# Patient Record
Sex: Male | Born: 1980 | Race: White | Hispanic: No | Marital: Single | Smoking: Current every day smoker
Health system: Southern US, Community
[De-identification: ages and names within clinical notes are randomized; demographics above are authoritative.]

## PROBLEM LIST (undated history)

## (undated) DIAGNOSIS — F10239 Alcohol dependence with withdrawal, unspecified: Secondary | ICD-10-CM

## (undated) DIAGNOSIS — R569 Unspecified convulsions: Secondary | ICD-10-CM

## (undated) DIAGNOSIS — F10231 Alcohol dependence with withdrawal delirium: Secondary | ICD-10-CM

## (undated) DIAGNOSIS — K852 Alcohol induced acute pancreatitis without necrosis or infection: Secondary | ICD-10-CM

## (undated) DIAGNOSIS — F1020 Alcohol dependence, uncomplicated: Secondary | ICD-10-CM

## (undated) DIAGNOSIS — D649 Anemia, unspecified: Secondary | ICD-10-CM

---

## 2005-05-31 ENCOUNTER — Emergency Department: Payer: Self-pay | Admitting: Emergency Medicine

## 2005-12-24 ENCOUNTER — Emergency Department: Payer: Self-pay | Admitting: Internal Medicine

## 2006-07-07 HISTORY — PX: INCISION AND DRAINAGE ABSCESS: SHX5864

## 2008-05-26 ENCOUNTER — Emergency Department: Payer: Self-pay | Admitting: Emergency Medicine

## 2008-08-30 ENCOUNTER — Inpatient Hospital Stay: Payer: Self-pay | Admitting: Internal Medicine

## 2010-04-04 ENCOUNTER — Emergency Department: Payer: Self-pay | Admitting: Emergency Medicine

## 2011-07-08 DIAGNOSIS — F10239 Alcohol dependence with withdrawal, unspecified: Secondary | ICD-10-CM

## 2011-07-08 DIAGNOSIS — R569 Unspecified convulsions: Secondary | ICD-10-CM

## 2011-07-08 DIAGNOSIS — F10939 Alcohol use, unspecified with withdrawal, unspecified: Secondary | ICD-10-CM

## 2011-07-08 HISTORY — DX: Alcohol dependence with withdrawal, unspecified: F10.239

## 2011-07-08 HISTORY — DX: Alcohol use, unspecified with withdrawal, unspecified: F10.939

## 2011-07-08 HISTORY — DX: Unspecified convulsions: R56.9

## 2012-07-28 ENCOUNTER — Emergency Department: Payer: Self-pay | Admitting: Unknown Physician Specialty

## 2012-07-29 LAB — URINALYSIS, COMPLETE
Bilirubin,UR: NEGATIVE
Glucose,UR: 50 mg/dL (ref 0–75)
Leukocyte Esterase: NEGATIVE
Nitrite: NEGATIVE
Ph: 7 (ref 4.5–8.0)
RBC,UR: 1 /HPF (ref 0–5)
Specific Gravity: 1.003 (ref 1.003–1.030)
Squamous Epithelial: <1
WBC UR: 1 /HPF (ref 0–5)

## 2012-07-29 LAB — COMPREHENSIVE METABOLIC PANEL
Alkaline Phosphatase: 98 U/L (ref 50–136)
Bilirubin,Total: 0.8 mg/dL (ref 0.2–1.0)
Calcium, Total: 8.4 mg/dL — ABNORMAL LOW (ref 8.5–10.1)
Chloride: 96 mmol/L — ABNORMAL LOW (ref 98–107)
Co2: 19 mmol/L — ABNORMAL LOW (ref 21–32)
Creatinine: 1 mg/dL (ref 0.60–1.30)
EGFR (African American): >60
Potassium: 3.8 mmol/L (ref 3.5–5.1)
SGOT(AST): 72 U/L — ABNORMAL HIGH (ref 15–37)
SGPT (ALT): 88 U/L — ABNORMAL HIGH (ref 12–78)
Sodium: 131 mmol/L — ABNORMAL LOW (ref 136–145)
Total Protein: 7.6 g/dL (ref 6.4–8.2)

## 2012-07-29 LAB — CBC
HCT: 39.8 % — ABNORMAL LOW (ref 40.0–52.0)
HGB: 13.2 g/dL (ref 13.0–18.0)
Platelet: 148 10*3/uL — ABNORMAL LOW (ref 150–440)

## 2012-07-29 LAB — PROTIME-INR: INR: 1.1

## 2012-07-29 LAB — DRUG SCREEN, URINE
Barbiturates, Ur Screen: NEGATIVE (ref ?–200)
Cannabinoid 50 Ng, Ur ~~LOC~~: NEGATIVE (ref ?–50)
Cocaine Metabolite,Ur ~~LOC~~: NEGATIVE (ref ?–300)
MDMA (Ecstasy)Ur Screen: NEGATIVE (ref ?–500)
Methadone, Ur Screen: NEGATIVE (ref ?–300)
Opiate, Ur Screen: NEGATIVE (ref ?–300)
Tricyclic, Ur Screen: NEGATIVE (ref ?–1000)

## 2012-07-29 LAB — TROPONIN I: Troponin-I: <0.02 ng/mL

## 2012-07-29 LAB — ETHANOL
Ethanol %: <0.003 % (ref 0.000–0.080)
Ethanol: <3 mg/dL

## 2012-07-29 LAB — MAGNESIUM: Magnesium: 1.8 mg/dL

## 2012-07-29 LAB — LIPASE, BLOOD: Lipase: 310 U/L (ref 73–393)

## 2012-11-10 ENCOUNTER — Inpatient Hospital Stay: Payer: Self-pay | Admitting: Psychiatry

## 2012-11-10 DIAGNOSIS — I472 Ventricular tachycardia, unspecified: Secondary | ICD-10-CM

## 2012-11-10 LAB — URINALYSIS, COMPLETE
Bacteria: NONE SEEN
Blood: NEGATIVE
Glucose,UR: NEGATIVE mg/dL (ref 0–75)
Ketone: NEGATIVE
Leukocyte Esterase: NEGATIVE
Nitrite: NEGATIVE
RBC,UR: NONE SEEN /HPF (ref 0–5)
Squamous Epithelial: <1

## 2012-11-10 LAB — ETHANOL: Ethanol %: 0.169 % — ABNORMAL HIGH (ref 0.000–0.080)

## 2012-11-10 LAB — DRUG SCREEN, URINE
Amphetamines, Ur Screen: NEGATIVE (ref ?–1000)
Cocaine Metabolite,Ur ~~LOC~~: NEGATIVE (ref ?–300)
MDMA (Ecstasy)Ur Screen: NEGATIVE (ref ?–500)
Methadone, Ur Screen: NEGATIVE (ref ?–300)
Phencyclidine (PCP) Ur S: NEGATIVE (ref ?–25)

## 2012-11-10 LAB — COMPREHENSIVE METABOLIC PANEL
Albumin: 4.5 g/dL (ref 3.4–5.0)
Co2: 27 mmol/L (ref 21–32)
Creatinine: 0.75 mg/dL (ref 0.60–1.30)
EGFR (Non-African Amer.): >60
Glucose: 99 mg/dL (ref 65–99)
Osmolality: 273 (ref 275–301)
Potassium: 4.3 mmol/L (ref 3.5–5.1)
SGOT(AST): 297 U/L — ABNORMAL HIGH (ref 15–37)
Sodium: 138 mmol/L (ref 136–145)

## 2012-11-10 LAB — CBC
HGB: 14.7 g/dL (ref 13.0–18.0)
MCHC: 34.5 g/dL (ref 32.0–36.0)
MCV: 100 fL (ref 80–100)
RBC: 4.26 10*6/uL — ABNORMAL LOW (ref 4.40–5.90)
WBC: 3.8 10*3/uL (ref 3.8–10.6)

## 2012-11-10 LAB — LIPASE, BLOOD: Lipase: 599 U/L — ABNORMAL HIGH (ref 73–393)

## 2012-11-10 LAB — TSH: Thyroid Stimulating Horm: 1.73 u[IU]/mL

## 2012-11-11 LAB — LIPASE, BLOOD: Lipase: 469 U/L — ABNORMAL HIGH (ref 73–393)

## 2014-04-06 DIAGNOSIS — K852 Alcohol induced acute pancreatitis without necrosis or infection: Secondary | ICD-10-CM

## 2014-04-06 HISTORY — DX: Alcohol induced acute pancreatitis without necrosis or infection: K85.20

## 2014-07-07 DIAGNOSIS — F10231 Alcohol dependence with withdrawal delirium: Secondary | ICD-10-CM

## 2014-07-07 DIAGNOSIS — F10931 Alcohol use, unspecified with withdrawal delirium: Secondary | ICD-10-CM

## 2014-07-07 HISTORY — DX: Alcohol use, unspecified with withdrawal delirium: F10.931

## 2014-07-07 HISTORY — DX: Alcohol dependence with withdrawal delirium: F10.231

## 2014-07-30 ENCOUNTER — Inpatient Hospital Stay: Payer: Self-pay | Admitting: Internal Medicine

## 2014-07-30 LAB — COMPREHENSIVE METABOLIC PANEL
ALK PHOS: 115 U/L
Albumin: 3 g/dL — ABNORMAL LOW (ref 3.4–5.0)
Anion Gap: 10 (ref 7–16)
BILIRUBIN TOTAL: 0.8 mg/dL (ref 0.2–1.0)
BUN: 11 mg/dL (ref 7–18)
CO2: 23 mmol/L (ref 21–32)
CREATININE: 0.93 mg/dL (ref 0.60–1.30)
Calcium, Total: 8.2 mg/dL — ABNORMAL LOW (ref 8.5–10.1)
Chloride: 103 mmol/L (ref 98–107)
EGFR (African American): >60
Glucose: 137 mg/dL — ABNORMAL HIGH (ref 65–99)
OSMOLALITY: 273 (ref 275–301)
POTASSIUM: 4 mmol/L (ref 3.5–5.1)
SGOT(AST): 95 U/L — ABNORMAL HIGH (ref 15–37)
SGPT (ALT): 68 U/L — ABNORMAL HIGH
Sodium: 136 mmol/L (ref 136–145)
Total Protein: 7.5 g/dL (ref 6.4–8.2)

## 2014-07-30 LAB — CBC
HCT: 45.1 % (ref 40.0–52.0)
HGB: 15.2 g/dL (ref 13.0–18.0)
MCH: 34.3 pg — ABNORMAL HIGH (ref 26.0–34.0)
MCHC: 33.6 g/dL (ref 32.0–36.0)
MCV: 102 fL — ABNORMAL HIGH (ref 80–100)
Platelet: 189 10*3/uL (ref 150–440)
RBC: 4.43 10*6/uL (ref 4.40–5.90)
RDW: 12.7 % (ref 11.5–14.5)
WBC: 9.9 10*3/uL (ref 3.8–10.6)

## 2014-07-30 LAB — LIPASE, BLOOD: Lipase: 3550 U/L — ABNORMAL HIGH (ref 73–393)

## 2014-07-30 LAB — HEMOGLOBIN: HGB: 14.9 g/dL (ref 13.0–18.0)

## 2014-07-31 DIAGNOSIS — R000 Tachycardia, unspecified: Secondary | ICD-10-CM

## 2014-07-31 LAB — CBC WITH DIFFERENTIAL/PLATELET
BASOS ABS: 0 10*3/uL (ref 0.0–0.1)
BASOS PCT: 0.2 %
EOS ABS: 0 10*3/uL (ref 0.0–0.7)
EOS PCT: 0 %
HCT: 48.9 % (ref 40.0–52.0)
HGB: 16.3 g/dL (ref 13.0–18.0)
LYMPHS PCT: 3.6 %
Lymphocyte #: 0.9 10*3/uL — ABNORMAL LOW (ref 1.0–3.6)
MCH: 33.7 pg (ref 26.0–34.0)
MCHC: 33.3 g/dL (ref 32.0–36.0)
MCV: 101 fL — ABNORMAL HIGH (ref 80–100)
Monocyte #: 0.8 x10 3/mm (ref 0.2–1.0)
Monocyte %: 3.4 %
NEUTROS ABS: 22.3 10*3/uL — AB (ref 1.4–6.5)
Neutrophil %: 92.8 %
Platelet: 158 10*3/uL (ref 150–440)
RBC: 4.83 10*6/uL (ref 4.40–5.90)
RDW: 12.7 % (ref 11.5–14.5)
WBC: 24 10*3/uL — ABNORMAL HIGH (ref 3.8–10.6)

## 2014-07-31 LAB — COMPREHENSIVE METABOLIC PANEL
ALT: 44 U/L
Albumin: 2.4 g/dL — ABNORMAL LOW (ref 3.4–5.0)
Alkaline Phosphatase: 97 U/L
Anion Gap: 9 (ref 7–16)
BILIRUBIN TOTAL: 2 mg/dL — AB (ref 0.2–1.0)
BUN: 11 mg/dL (ref 7–18)
CO2: 22 mmol/L (ref 21–32)
CREATININE: 1.11 mg/dL (ref 0.60–1.30)
Calcium, Total: 6.6 mg/dL — CL (ref 8.5–10.1)
Chloride: 108 mmol/L — ABNORMAL HIGH (ref 98–107)
EGFR (African American): >60
GLUCOSE: 124 mg/dL — AB (ref 65–99)
Osmolality: 278 (ref 275–301)
Potassium: 3.9 mmol/L (ref 3.5–5.1)
SGOT(AST): 70 U/L — ABNORMAL HIGH (ref 15–37)
Sodium: 139 mmol/L (ref 136–145)
Total Protein: 6.1 g/dL — ABNORMAL LOW (ref 6.4–8.2)

## 2014-07-31 LAB — LIPID PANEL
Cholesterol: 102 mg/dL (ref 0–200)
HDL Cholesterol: 9 mg/dL — ABNORMAL LOW (ref 40–60)
LDL CHOLESTEROL, CALC: 36 mg/dL (ref 0–100)
Triglycerides: 283 mg/dL — ABNORMAL HIGH (ref 0–200)
VLDL CHOLESTEROL, CALC: 57 mg/dL — AB (ref 5–40)

## 2014-07-31 LAB — LIPASE, BLOOD: Lipase: 4368 U/L — ABNORMAL HIGH (ref 73–393)

## 2014-08-01 LAB — COMPREHENSIVE METABOLIC PANEL
ALBUMIN: 1.8 g/dL — AB (ref 3.4–5.0)
ALK PHOS: 72 U/L (ref 46–116)
Anion Gap: 9 (ref 7–16)
BILIRUBIN TOTAL: 1.4 mg/dL — AB (ref 0.2–1.0)
BUN: 9 mg/dL (ref 7–18)
CALCIUM: 5.9 mg/dL — AB (ref 8.5–10.1)
CREATININE: 0.96 mg/dL (ref 0.60–1.30)
Chloride: 110 mmol/L — ABNORMAL HIGH (ref 98–107)
Co2: 24 mmol/L (ref 21–32)
EGFR (African American): >60
Glucose: 107 mg/dL — ABNORMAL HIGH (ref 65–99)
Osmolality: 284 (ref 275–301)
POTASSIUM: 3.3 mmol/L — AB (ref 3.5–5.1)
SGOT(AST): 54 U/L — ABNORMAL HIGH (ref 15–37)
SGPT (ALT): 26 U/L (ref 14–63)
Sodium: 143 mmol/L (ref 136–145)
Total Protein: 5.1 g/dL — ABNORMAL LOW (ref 6.4–8.2)

## 2014-08-01 LAB — LIPASE, BLOOD: Lipase: 1910 U/L — ABNORMAL HIGH (ref 73–393)

## 2014-08-01 LAB — HEMOGLOBIN: HGB: 12.6 g/dL — ABNORMAL LOW

## 2014-08-02 LAB — COMPREHENSIVE METABOLIC PANEL
ALBUMIN: 1.8 g/dL — AB (ref 3.4–5.0)
Alkaline Phosphatase: 68 U/L (ref 46–116)
Anion Gap: 10 (ref 7–16)
BUN: 9 mg/dL (ref 7–18)
Bilirubin,Total: 1.4 mg/dL — ABNORMAL HIGH (ref 0.2–1.0)
CHLORIDE: 112 mmol/L — AB (ref 98–107)
Calcium, Total: 6.3 mg/dL — CL (ref 8.5–10.1)
Co2: 23 mmol/L (ref 21–32)
Creatinine: 0.8 mg/dL (ref 0.60–1.30)
EGFR (African American): >60
EGFR (Non-African Amer.): >60
Glucose: 63 mg/dL — ABNORMAL LOW (ref 65–99)
Osmolality: 285 (ref 275–301)
Potassium: 3.5 mmol/L (ref 3.5–5.1)
SGOT(AST): 50 U/L — ABNORMAL HIGH (ref 15–37)
SGPT (ALT): 25 U/L (ref 14–63)
SODIUM: 145 mmol/L (ref 136–145)
Total Protein: 5.3 g/dL — ABNORMAL LOW (ref 6.4–8.2)

## 2014-08-02 LAB — LIPASE, BLOOD: Lipase: 679 U/L — ABNORMAL HIGH (ref 73–393)

## 2014-08-02 LAB — PHOSPHORUS: PHOSPHORUS: 1.4 mg/dL — AB (ref 2.5–4.9)

## 2014-08-02 LAB — MAGNESIUM: MAGNESIUM: 2.2 mg/dL

## 2014-08-03 LAB — MAGNESIUM: MAGNESIUM: 2.1 mg/dL

## 2014-08-03 LAB — COMPREHENSIVE METABOLIC PANEL
ALK PHOS: 82 U/L (ref 46–116)
ANION GAP: 8 (ref 7–16)
Albumin: 1.9 g/dL — ABNORMAL LOW (ref 3.4–5.0)
BUN: 6 mg/dL — ABNORMAL LOW (ref 7–18)
Bilirubin,Total: 1.5 mg/dL — ABNORMAL HIGH (ref 0.2–1.0)
CALCIUM: 7.3 mg/dL — AB (ref 8.5–10.1)
CREATININE: 0.77 mg/dL (ref 0.60–1.30)
Chloride: 101 mmol/L (ref 98–107)
Co2: 28 mmol/L (ref 21–32)
Glucose: 82 mg/dL (ref 65–99)
OSMOLALITY: 271 (ref 275–301)
Potassium: 3.2 mmol/L — ABNORMAL LOW (ref 3.5–5.1)
SGOT(AST): 64 U/L — ABNORMAL HIGH (ref 15–37)
SGPT (ALT): 29 U/L (ref 14–63)
Sodium: 137 mmol/L (ref 136–145)
Total Protein: 5.8 g/dL — ABNORMAL LOW (ref 6.4–8.2)

## 2014-08-03 LAB — POTASSIUM: Potassium: 3.8 mmol/L (ref 3.5–5.1)

## 2014-08-03 LAB — LIPASE, BLOOD: Lipase: 376 U/L (ref 73–393)

## 2014-08-03 LAB — PHOSPHORUS: Phosphorus: 1.5 mg/dL — ABNORMAL LOW (ref 2.5–4.9)

## 2014-08-04 LAB — URINALYSIS, COMPLETE
BACTERIA: NONE SEEN
Bilirubin,UR: NEGATIVE
Blood: NEGATIVE
GLUCOSE, UR: NEGATIVE mg/dL (ref 0–75)
Ketone: NEGATIVE
Leukocyte Esterase: NEGATIVE
NITRITE: NEGATIVE
Ph: 8 (ref 4.5–8.0)
Protein: 30
RBC,UR: NONE SEEN /HPF (ref 0–5)
Specific Gravity: 1.01 (ref 1.003–1.030)
Squamous Epithelial: NONE SEEN

## 2014-08-04 LAB — BASIC METABOLIC PANEL
Anion Gap: 5 — ABNORMAL LOW (ref 7–16)
BUN: 6 mg/dL — ABNORMAL LOW (ref 7–18)
CALCIUM: 7.7 mg/dL — AB (ref 8.5–10.1)
CO2: 31 mmol/L (ref 21–32)
CREATININE: 0.74 mg/dL (ref 0.60–1.30)
Chloride: 101 mmol/L (ref 98–107)
EGFR (African American): >60
Glucose: 110 mg/dL — ABNORMAL HIGH (ref 65–99)
Osmolality: 272 (ref 275–301)
Potassium: 3.4 mmol/L — ABNORMAL LOW (ref 3.5–5.1)
SODIUM: 137 mmol/L (ref 136–145)

## 2014-08-04 LAB — CBC WITH DIFFERENTIAL/PLATELET
Basophil #: 0.1 10*3/uL (ref 0.0–0.1)
Basophil %: 0.3 %
EOS PCT: 1 %
Eosinophil #: 0.2 10*3/uL (ref 0.0–0.7)
HCT: 32.7 % — AB (ref 40.0–52.0)
HGB: 11 g/dL — AB (ref 13.0–18.0)
LYMPHS ABS: 1 10*3/uL (ref 1.0–3.6)
Lymphocyte %: 5.7 %
MCH: 33.8 pg (ref 26.0–34.0)
MCHC: 33.6 g/dL (ref 32.0–36.0)
MCV: 101 fL — ABNORMAL HIGH (ref 80–100)
Monocyte #: 2.3 x10 3/mm — ABNORMAL HIGH (ref 0.2–1.0)
Monocyte %: 12.6 %
Neutrophil #: 14.8 10*3/uL — ABNORMAL HIGH (ref 1.4–6.5)
Neutrophil %: 80.4 %
Platelet: 202 10*3/uL (ref 150–440)
RBC: 3.25 10*6/uL — ABNORMAL LOW (ref 4.40–5.90)
RDW: 12.1 % (ref 11.5–14.5)
WBC: 18.4 10*3/uL — AB (ref 3.8–10.6)

## 2014-08-04 LAB — POTASSIUM: POTASSIUM: 4.1 mmol/L (ref 3.5–5.1)

## 2014-08-05 LAB — MAGNESIUM: MAGNESIUM: 2.3 mg/dL

## 2014-08-06 LAB — BASIC METABOLIC PANEL
Anion Gap: 5 — ABNORMAL LOW (ref 7–16)
BUN: 7 mg/dL (ref 7–18)
CHLORIDE: 100 mmol/L (ref 98–107)
Calcium, Total: 8.2 mg/dL — ABNORMAL LOW (ref 8.5–10.1)
Co2: 32 mmol/L (ref 21–32)
Creatinine: 0.89 mg/dL (ref 0.60–1.30)
EGFR (Non-African Amer.): >60
Glucose: 138 mg/dL — ABNORMAL HIGH (ref 65–99)
Osmolality: 274 (ref 275–301)
Potassium: 3.7 mmol/L (ref 3.5–5.1)
SODIUM: 137 mmol/L (ref 136–145)

## 2014-08-06 LAB — OCCULT BLOOD X 1 CARD TO LAB, STOOL: OCCULT BLOOD, FECES: NEGATIVE

## 2014-08-06 LAB — LIPASE, BLOOD: LIPASE: 662 U/L — AB (ref 73–393)

## 2014-08-07 DIAGNOSIS — F1020 Alcohol dependence, uncomplicated: Secondary | ICD-10-CM

## 2014-08-07 DIAGNOSIS — D649 Anemia, unspecified: Secondary | ICD-10-CM

## 2014-08-07 HISTORY — DX: Alcohol dependence, uncomplicated: F10.20

## 2014-08-07 HISTORY — DX: Anemia, unspecified: D64.9

## 2014-08-08 LAB — TPN PANEL
ACTIVATED PTT: 32.3 s (ref 23.6–35.9)
ALK PHOS: 99 U/L (ref 46–116)
ANION GAP: 8 (ref 7–16)
AST: 48 U/L — AB (ref 15–37)
Albumin: 2.1 g/dL — ABNORMAL LOW (ref 3.4–5.0)
BUN: 7 mg/dL (ref 7–18)
CALCIUM: 8.6 mg/dL (ref 8.5–10.1)
CO2: 27 mmol/L (ref 21–32)
CREATININE: 0.78 mg/dL (ref 0.60–1.30)
Chloride: 103 mmol/L (ref 98–107)
Cholesterol: 92 mg/dL (ref 0–200)
EGFR (African American): >60
Glucose: 146 mg/dL — ABNORMAL HIGH (ref 65–99)
HGB: 10.3 g/dL — ABNORMAL LOW (ref 13.0–18.0)
INR: 1.3
Magnesium: 2.1 mg/dL
Osmolality: 276 (ref 275–301)
Phosphorus: 3.1 mg/dL (ref 2.5–4.9)
Platelet: 484 10*3/uL — ABNORMAL HIGH (ref 150–440)
Potassium: 3.6 mmol/L (ref 3.5–5.1)
Prothrombin Time: 16.1 secs — ABNORMAL HIGH
SODIUM: 138 mmol/L (ref 136–145)
TOTAL PROTEIN: 6.9 g/dL (ref 6.4–8.2)
TRIGLYCERIDES: 121 mg/dL (ref 0–200)
WBC: 26.4 10*3/uL — AB (ref 3.8–10.6)

## 2014-08-08 LAB — AMMONIA: Ammonia, Plasma: 30 mcmol/L (ref 11–32)

## 2014-08-09 LAB — LIPASE, BLOOD: LIPASE: 1081 U/L — AB (ref 73–393)

## 2014-08-09 LAB — CBC WITH DIFFERENTIAL/PLATELET
Bands: 4 %
EOS PCT: 1 %
HCT: 30.3 % — AB (ref 40.0–52.0)
HGB: 10.2 g/dL — AB (ref 13.0–18.0)
Lymphocytes: 5 %
MCH: 33.5 pg (ref 26.0–34.0)
MCHC: 33.8 g/dL (ref 32.0–36.0)
MCV: 99 fL (ref 80–100)
METAMYELOCYTE: 4 %
MYELOCYTE: 3 %
Monocytes: 9 %
PLATELETS: 591 10*3/uL — AB (ref 150–440)
Promyelocyte: 1 %
RBC: 3.06 10*6/uL — AB (ref 4.40–5.90)
RDW: 12.5 % (ref 11.5–14.5)
Segmented Neutrophils: 73 %
WBC: 29.8 10*3/uL — AB (ref 3.8–10.6)

## 2014-08-09 LAB — HEPATIC FUNCTION PANEL A (ARMC)
ALK PHOS: 86 U/L (ref 46–116)
AST: 47 U/L — AB (ref 15–37)
Albumin: 2.1 g/dL — ABNORMAL LOW (ref 3.4–5.0)
Bilirubin, Direct: 0.2 mg/dL (ref 0.0–0.2)
Bilirubin,Total: 0.5 mg/dL (ref 0.2–1.0)
SGPT (ALT): 64 U/L — ABNORMAL HIGH (ref 14–63)
Total Protein: 7.2 g/dL (ref 6.4–8.2)

## 2014-08-09 LAB — CULTURE, BLOOD (SINGLE)

## 2014-08-09 LAB — BASIC METABOLIC PANEL
ANION GAP: 8 (ref 7–16)
BUN: 9 mg/dL (ref 7–18)
CALCIUM: 8.6 mg/dL (ref 8.5–10.1)
Chloride: 103 mmol/L (ref 98–107)
Co2: 26 mmol/L (ref 21–32)
Creatinine: 0.91 mg/dL (ref 0.60–1.30)
EGFR (African American): >60
GLUCOSE: 141 mg/dL — AB (ref 65–99)
OSMOLALITY: 275 (ref 275–301)
POTASSIUM: 3.7 mmol/L (ref 3.5–5.1)
Sodium: 137 mmol/L (ref 136–145)

## 2014-08-09 LAB — MAGNESIUM: Magnesium: 2.1 mg/dL

## 2014-08-09 LAB — PHOSPHORUS: PHOSPHORUS: 4.6 mg/dL (ref 2.5–4.9)

## 2014-08-10 LAB — BASIC METABOLIC PANEL
Anion Gap: 5 — ABNORMAL LOW (ref 7–16)
BUN: 11 mg/dL (ref 7–18)
CALCIUM: 8.4 mg/dL — AB (ref 8.5–10.1)
CO2: 29 mmol/L (ref 21–32)
Chloride: 101 mmol/L (ref 98–107)
Creatinine: 0.87 mg/dL (ref 0.60–1.30)
EGFR (Non-African Amer.): >60
Glucose: 122 mg/dL — ABNORMAL HIGH (ref 65–99)
OSMOLALITY: 271 (ref 275–301)
Potassium: 4 mmol/L (ref 3.5–5.1)
Sodium: 135 mmol/L — ABNORMAL LOW (ref 136–145)

## 2014-08-10 LAB — MAGNESIUM: MAGNESIUM: 2.3 mg/dL

## 2014-08-10 LAB — PHOSPHORUS: Phosphorus: 3.7 mg/dL (ref 2.5–4.9)

## 2014-08-11 LAB — PHOSPHORUS: Phosphorus: 3.4 mg/dL (ref 2.5–4.9)

## 2014-08-11 LAB — BASIC METABOLIC PANEL
Anion Gap: 7 (ref 7–16)
BUN: 11 mg/dL (ref 7–18)
CO2: 27 mmol/L (ref 21–32)
CREATININE: 0.8 mg/dL (ref 0.60–1.30)
Calcium, Total: 8.3 mg/dL — ABNORMAL LOW (ref 8.5–10.1)
Chloride: 102 mmol/L (ref 98–107)
EGFR (Non-African Amer.): >60
Glucose: 119 mg/dL — ABNORMAL HIGH (ref 65–99)
Osmolality: 272 (ref 275–301)
Potassium: 4 mmol/L (ref 3.5–5.1)
SODIUM: 136 mmol/L (ref 136–145)

## 2014-08-11 LAB — MAGNESIUM: MAGNESIUM: 2.3 mg/dL

## 2014-08-12 LAB — HEPATIC FUNCTION PANEL A (ARMC)
Albumin: 2.1 g/dL — ABNORMAL LOW (ref 3.4–5.0)
Alkaline Phosphatase: 73 U/L (ref 46–116)
Bilirubin, Direct: 0.2 mg/dL (ref 0.0–0.2)
Bilirubin,Total: 0.4 mg/dL (ref 0.2–1.0)
SGOT(AST): 38 U/L — ABNORMAL HIGH (ref 15–37)
SGPT (ALT): 47 U/L (ref 14–63)
TOTAL PROTEIN: 7.5 g/dL (ref 6.4–8.2)

## 2014-08-12 LAB — BASIC METABOLIC PANEL
Anion Gap: 7 (ref 7–16)
BUN: 10 mg/dL (ref 7–18)
CREATININE: 0.8 mg/dL (ref 0.60–1.30)
Calcium, Total: 8.5 mg/dL (ref 8.5–10.1)
Chloride: 100 mmol/L (ref 98–107)
Co2: 28 mmol/L (ref 21–32)
EGFR (African American): >60
EGFR (Non-African Amer.): >60
Glucose: 125 mg/dL — ABNORMAL HIGH (ref 65–99)
Osmolality: 271 (ref 275–301)
POTASSIUM: 4.2 mmol/L (ref 3.5–5.1)
Sodium: 135 mmol/L — ABNORMAL LOW (ref 136–145)

## 2014-08-12 LAB — CBC WITH DIFFERENTIAL/PLATELET
BANDS NEUTROPHIL: 4 %
Comment - H1-Com2: NORMAL
Eosinophil: 2 %
HCT: 28.7 % — ABNORMAL LOW (ref 40.0–52.0)
HGB: 9.5 g/dL — ABNORMAL LOW (ref 13.0–18.0)
LYMPHS PCT: 7 %
MCH: 32.6 pg (ref 26.0–34.0)
MCHC: 33 g/dL (ref 32.0–36.0)
MCV: 99 fL (ref 80–100)
Metamyelocyte: 2 %
Monocytes: 7 %
Myelocyte: 2 %
PLATELETS: 863 10*3/uL — AB (ref 150–440)
RBC: 2.91 10*6/uL — ABNORMAL LOW (ref 4.40–5.90)
RDW: 12.1 % (ref 11.5–14.5)
Segmented Neutrophils: 76 %
WBC: 26 10*3/uL — ABNORMAL HIGH (ref 3.8–10.6)

## 2014-08-12 LAB — LIPASE, BLOOD: Lipase: 850 U/L — ABNORMAL HIGH (ref 73–393)

## 2014-08-12 LAB — MAGNESIUM: Magnesium: 2.2 mg/dL

## 2014-08-12 LAB — PHOSPHORUS: PHOSPHORUS: 4.4 mg/dL (ref 2.5–4.9)

## 2014-08-13 LAB — BASIC METABOLIC PANEL
ANION GAP: 9 (ref 7–16)
BUN: 9 mg/dL (ref 7–18)
CALCIUM: 8.7 mg/dL (ref 8.5–10.1)
Chloride: 98 mmol/L (ref 98–107)
Co2: 27 mmol/L (ref 21–32)
Creatinine: 0.87 mg/dL (ref 0.60–1.30)
EGFR (African American): >60
GLUCOSE: 153 mg/dL — AB (ref 65–99)
OSMOLALITY: 270 (ref 275–301)
POTASSIUM: 4.4 mmol/L (ref 3.5–5.1)
Sodium: 134 mmol/L — ABNORMAL LOW (ref 136–145)

## 2014-08-13 LAB — MAGNESIUM: Magnesium: 2.2 mg/dL

## 2014-08-13 LAB — PHOSPHORUS: PHOSPHORUS: 4.4 mg/dL (ref 2.5–4.9)

## 2014-08-14 LAB — CULTURE, BLOOD (SINGLE)

## 2014-08-14 LAB — BASIC METABOLIC PANEL
Anion Gap: 5 — ABNORMAL LOW (ref 7–16)
BUN: 10 mg/dL (ref 7–18)
CALCIUM: 8.8 mg/dL (ref 8.5–10.1)
CREATININE: 0.84 mg/dL (ref 0.60–1.30)
Chloride: 100 mmol/L (ref 98–107)
Co2: 30 mmol/L (ref 21–32)
EGFR (African American): >60
EGFR (Non-African Amer.): >60
GLUCOSE: 121 mg/dL — AB (ref 65–99)
Osmolality: 270 (ref 275–301)
POTASSIUM: 4.5 mmol/L (ref 3.5–5.1)
SODIUM: 135 mmol/L — AB (ref 136–145)

## 2014-08-14 LAB — PHOSPHORUS: Phosphorus: 4.9 mg/dL (ref 2.5–4.9)

## 2014-08-14 LAB — LIPASE, BLOOD: Lipase: 717 U/L — ABNORMAL HIGH (ref 73–393)

## 2014-08-14 LAB — MAGNESIUM: Magnesium: 2.2 mg/dL

## 2014-08-15 ENCOUNTER — Observation Stay (HOSPITAL_COMMUNITY): Payer: Self-pay

## 2014-08-15 ENCOUNTER — Encounter (HOSPITAL_COMMUNITY): Payer: Self-pay | Admitting: General Surgery

## 2014-08-15 ENCOUNTER — Inpatient Hospital Stay (HOSPITAL_COMMUNITY)
Admission: AD | Admit: 2014-08-15 | Discharge: 2014-08-20 | DRG: 439 | Disposition: A | Discharge status: Home / Self Care | Payer: Self-pay | Source: Other Acute Inpatient Hospital | Attending: Internal Medicine | Admitting: Internal Medicine

## 2014-08-15 DIAGNOSIS — F1020 Alcohol dependence, uncomplicated: Secondary | ICD-10-CM | POA: Diagnosis present

## 2014-08-15 DIAGNOSIS — R4182 Altered mental status, unspecified: Secondary | ICD-10-CM

## 2014-08-15 DIAGNOSIS — K863 Pseudocyst of pancreas: Secondary | ICD-10-CM | POA: Diagnosis present

## 2014-08-15 DIAGNOSIS — R4781 Slurred speech: Secondary | ICD-10-CM | POA: Diagnosis present

## 2014-08-15 DIAGNOSIS — F1010 Alcohol abuse, uncomplicated: Secondary | ICD-10-CM | POA: Diagnosis present

## 2014-08-15 DIAGNOSIS — K852 Alcohol induced acute pancreatitis without necrosis or infection: Secondary | ICD-10-CM

## 2014-08-15 DIAGNOSIS — D473 Essential (hemorrhagic) thrombocythemia: Secondary | ICD-10-CM | POA: Diagnosis present

## 2014-08-15 DIAGNOSIS — G253 Myoclonus: Secondary | ICD-10-CM | POA: Diagnosis present

## 2014-08-15 DIAGNOSIS — Z811 Family history of alcohol abuse and dependence: Secondary | ICD-10-CM

## 2014-08-15 DIAGNOSIS — K859 Acute pancreatitis without necrosis or infection, unspecified: Secondary | ICD-10-CM | POA: Diagnosis present

## 2014-08-15 DIAGNOSIS — J9811 Atelectasis: Secondary | ICD-10-CM | POA: Diagnosis present

## 2014-08-15 DIAGNOSIS — G9340 Encephalopathy, unspecified: Secondary | ICD-10-CM | POA: Diagnosis present

## 2014-08-15 DIAGNOSIS — Z599 Problem related to housing and economic circumstances, unspecified: Secondary | ICD-10-CM

## 2014-08-15 DIAGNOSIS — Z452 Encounter for adjustment and management of vascular access device: Secondary | ICD-10-CM

## 2014-08-15 DIAGNOSIS — E512 Wernicke's encephalopathy: Secondary | ICD-10-CM | POA: Diagnosis present

## 2014-08-15 DIAGNOSIS — F17210 Nicotine dependence, cigarettes, uncomplicated: Secondary | ICD-10-CM | POA: Diagnosis present

## 2014-08-15 DIAGNOSIS — D649 Anemia, unspecified: Secondary | ICD-10-CM | POA: Diagnosis present

## 2014-08-15 HISTORY — DX: Alcohol dependence, uncomplicated: F10.20

## 2014-08-15 HISTORY — DX: Unspecified convulsions: R56.9

## 2014-08-15 HISTORY — DX: Alcohol dependence with withdrawal, unspecified: F10.239

## 2014-08-15 HISTORY — DX: Alcohol dependence with withdrawal delirium: F10.231

## 2014-08-15 HISTORY — DX: Anemia, unspecified: D64.9

## 2014-08-15 HISTORY — DX: Alcohol induced acute pancreatitis without necrosis or infection: K85.20

## 2014-08-15 LAB — COMPREHENSIVE METABOLIC PANEL
ALK PHOS: 72 U/L (ref 39–117)
ALT: 40 U/L (ref 0–53)
AST: 40 U/L — ABNORMAL HIGH (ref 0–37)
Albumin: 2.8 g/dL — ABNORMAL LOW (ref 3.5–5.2)
Anion gap: 12 (ref 5–15)
BUN: 12 mg/dL (ref 6–23)
CO2: 23 mmol/L (ref 19–32)
Calcium: 8.9 mg/dL (ref 8.4–10.5)
Chloride: 101 mmol/L (ref 96–112)
Creatinine, Ser: 0.69 mg/dL (ref 0.50–1.35)
GFR calc non Af Amer: >90 mL/min (ref >90)
Glucose, Bld: 90 mg/dL (ref 70–99)
POTASSIUM: 4.8 mmol/L (ref 3.5–5.1)
SODIUM: 136 mmol/L (ref 135–145)
TOTAL PROTEIN: 8.1 g/dL (ref 6.0–8.3)
Total Bilirubin: 0.7 mg/dL (ref 0.3–1.2)

## 2014-08-15 LAB — CBC
HEMATOCRIT: 29.8 % — AB (ref 39.0–52.0)
HEMOGLOBIN: 9.7 g/dL — AB (ref 13.0–17.0)
MCH: 32.7 pg (ref 26.0–34.0)
MCHC: 32.6 g/dL (ref 30.0–36.0)
MCV: 100.3 fL — ABNORMAL HIGH (ref 78.0–100.0)
PLATELETS: 628 10*3/uL — AB (ref 150–400)
RBC: 2.97 MIL/uL — AB (ref 4.22–5.81)
RDW: 12.3 % (ref 11.5–15.5)
WBC: 21 10*3/uL — AB (ref 4.0–10.5)

## 2014-08-15 LAB — PHOSPHORUS: PHOSPHORUS: 5.1 mg/dL — AB (ref 2.3–4.6)

## 2014-08-15 LAB — MAGNESIUM: Magnesium: 1.9 mg/dL (ref 1.5–2.5)

## 2014-08-15 LAB — LIPASE, BLOOD: LIPASE: 89 U/L — AB (ref 11–59)

## 2014-08-15 MED ORDER — ONDANSETRON HCL 4 MG PO TABS: 4.0000 mg | ORAL_TABLET | Freq: Four times a day (QID) | ORAL | Status: DC | PRN

## 2014-08-15 MED ORDER — IOHEXOL 300 MG/ML  SOLN
100.0000 mL | Freq: Once | INTRAMUSCULAR | Status: AC | PRN
  Administered 2014-08-15: 100 mL via INTRAVENOUS

## 2014-08-15 MED ORDER — IOHEXOL 300 MG/ML  SOLN
50.0000 mL | Freq: Once | INTRAMUSCULAR | Status: AC | PRN
  Administered 2014-08-15: 50 mL via ORAL

## 2014-08-15 MED ORDER — ONDANSETRON HCL 4 MG/2ML IJ SOLN: 4.0000 mg | Freq: Four times a day (QID) | INTRAMUSCULAR | Status: DC | PRN

## 2014-08-15 MED ORDER — OXYCODONE HCL 5 MG PO TABS
5.0000 mg | ORAL_TABLET | ORAL | Status: DC | PRN
  Administered 2014-08-16 – 2014-08-20 (×8): 10 mg via ORAL
  Filled 2014-08-15 (×8): qty 2

## 2014-08-15 MED ORDER — SODIUM CHLORIDE 0.9 % IV SOLN
INTRAVENOUS | Status: DC
  Administered 2014-08-15 – 2014-08-19 (×7): via INTRAVENOUS

## 2014-08-15 MED ORDER — MORPHINE SULFATE 2 MG/ML IJ SOLN
2.0000 mg | INTRAMUSCULAR | Status: DC | PRN
  Administered 2014-08-15 – 2014-08-16 (×3): 2 mg via INTRAVENOUS
  Filled 2014-08-15 (×3): qty 1

## 2014-08-15 MED ORDER — HEPARIN SODIUM (PORCINE) 5000 UNIT/ML IJ SOLN
5000.0000 [IU] | Freq: Three times a day (TID) | INTRAMUSCULAR | Status: DC
  Administered 2014-08-15 – 2014-08-20 (×12): 5000 [IU] via SUBCUTANEOUS
  Filled 2014-08-15 (×18): qty 1

## 2014-08-15 NOTE — Consult Note (Signed)
Reason for Consult:pancreatitis Referring Physician: Dr. Audelia Acton Brandon Oneill is an 34 y.o. male.  HPI: The pt is a 34 yo wm who presented to [facility] about 3 weeks ago with abdominal pain. He has been severely abusing alcohol for the last 10 years. He was found to have pancreatitis. CT done 1/25 shows severe pancreatitis with some free inflammatory fluid in the abdomen but no evidence of pancreatic necrosis or pseudocyst. He was scheduled to be transferred to [facility] but ended up getting tranferred to [facility]. He complains of abdominal pain. He has not had any nausea or vomiting in a couple weeks.  History reviewed. No pertinent past medical history.  History reviewed. No pertinent past surgical history.  History reviewed. No pertinent family history.  Social History:  has no tobacco, alcohol, and drug history on file.  Allergies: Not on File  Medications: I have reviewed the patient's current medications.  No results found for this or any previous visit (from the past 48 hour(s)).  No results found.  Review of Systems  Constitutional: Negative.   HENT: Negative.   Eyes: Negative.   Respiratory: Negative.   Cardiovascular: Negative.   Gastrointestinal: Positive for abdominal pain. Negative for nausea and vomiting.  Genitourinary: Negative.   Musculoskeletal: Negative.   Skin: Negative.   Neurological: Negative.   Endo/Heme/Allergies: Negative.   Psychiatric/Behavioral: The patient is nervous/anxious.    Blood pressure 113/61, pulse 100, temperature 98.6 F (37 C), temperature source Oral, resp. rate 18, height 6' (1.829 m), weight 200 lb (90.719 kg), SpO2 100 %. Physical Exam  Constitutional: He is oriented to person, place, and time. He appears well-developed and well-nourished.  HENT:  Head: Normocephalic and atraumatic.  Eyes: Conjunctivae and EOM are normal. Pupils are equal, round, and reactive to light.  Neck: Normal range of  motion. Neck supple.  Cardiovascular: Normal rate, regular rhythm and normal heart sounds.   Respiratory: Effort normal and breath sounds normal.  GI: Soft. Bowel sounds are normal.  There is moderate central tenderness  Musculoskeletal: Normal range of motion.  Neurological: He is alert and oriented to person, place, and time.  Skin: Skin is warm and dry.  Psychiatric: He has a normal mood and affect. His behavior is normal.    Assessment/Plan: The pt has severe pancreatitis from alcohol abuse. There was no evidence of pseudocyst or necrosis from the scan 2 weeks ago and I do not see any other scans on the computer since then. At this point I would consider rescanning him to see what he has now with respect to the pancreas. He may need tpn for nutrition support and bowel rest to let the pancreas continue to cool down. Will follow  TOTH III,Cele Mote S 08/15/2014, 2:51 PM

## 2014-08-15 NOTE — H&P (Signed)
[facility] History and Physical  Philo Kurtz IFO:[id number] DOB: [birth year] DOA: 08/15/2014  Referring physician: Dr. Aileen Fass PCP: No primary care provider on file.   Chief Complaint: abdominal discomfort  HPI: Brandon Oneill is a 34 y.o. male  Pt with recent admission at [facility] to acute pancreatitis. Patient was transferred here to [facility] for further evaluation and recommendations for suspected pseudocyst. The patient reports that he has not drank alcohol for more than 1 week. Still having abdominal discomfort. Denies any fevers or chills. No nausea reported.  Denies any emesis.   Review of Systems:  Constitutional:  No weight loss, night sweats, Fevers, chills, fatigue.  HEENT:  No headaches, Difficulty swallowing,Tooth/dental problems,Sore throat,  No sneezing, itching, ear ache, nasal congestion, post nasal drip,  Cardio-vascular:  No chest pain, Orthopnea, PND, swelling in lower extremities, anasarca, dizziness, palpitations  GI:  No heartburn, indigestion,+ abdominal pain, +nausea, vomiting, diarrhea, change in bowel habits,+ loss of appetite  Resp:  No shortness of breath with exertion or at rest. No excess mucus, no productive cough, No non-productive cough, No coughing up of blood.No change in color of mucus.No wheezing.No chest wall deformity  Skin:  no rash or lesions.  GU:  no dysuria, change in color of urine, no urgency or frequency. No flank pain.  Musculoskeletal:  No joint pain or swelling. No decreased range of motion. No back pain.  Psych:  No change in mood or affect. No depression or anxiety. No memory loss.   History reviewed. Medical history. History of alcohol use History reviewed. Surgical history: none reported. Social History: consumes alcohol daily prior to admission  Not on File  Family history - none reported when asked directly  Prior to Admission medications   Not on File   Physical  Exam: Filed Vitals:   08/15/14 1255  BP: 113/61  Pulse: 100  Temp: 98.6 F (37 C)  TempSrc: Oral  Resp: 18  Height: 6' (1.829 m)  Weight: 90.719 kg (200 lb)  SpO2: 100%    Wt Readings from Last 3 Encounters:  08/15/14 90.719 kg (200 lb)    General:  Appears calm and comfortable Eyes: PERRL, normal lids, irises & conjunctiva ENT: grossly normal hearing, lips & tongue Neck: no LAD, masses or thyromegaly Cardiovascular: RRR, no m/r/g. No LE edema. Telemetry: SR, no arrhythmias  Respiratory: CTA bilaterally, no w/r/r. Normal respiratory effort. Abdomen: soft, generalized discomfort with deep palpation, no rebound tenderness, + bowel sounds Skin: no rash or induration seen on limited exam Musculoskeletal: grossly normal tone BUE/BLE Psychiatric: grossly normal mood and affect, speech fluent and appropriate Neurologic: grossly non-focal.          Labs on Admission:  Basic Metabolic Panel: No results for input(s): NA, K, CL, CO2, GLUCOSE, BUN, CREATININE, CALCIUM, MG, PHOS in the last 168 hours. Liver Function Tests: No results for input(s): AST, ALT, ALKPHOS, BILITOT, PROT, ALBUMIN in the last 168 hours. No results for input(s): LIPASE, AMYLASE in the last 168 hours. No results for input(s): AMMONIA in the last 168 hours. CBC: No results for input(s): WBC, NEUTROABS, HGB, HCT, MCV, PLT in the last 168 hours. Cardiac Enzymes: No results for input(s): CKTOTAL, CKMB, CKMBINDEX, TROPONINI in the last 168 hours.  BNP (last 3 results) No results for input(s): BNP in the last 8760 hours.  ProBNP (last 3 results) No results for input(s): PROBNP in the last 8760 hours.  CBG: No results for input(s): GLUCAP in the last 168  hours.  Radiological Exams on Admission: No results found.   Assessment/Plan Active Problems:   Pancreatitis - Patient transferred from my understanding because of pancreatic pseudocyts but our surgeon reports that imaging studies do no support this.  Will repeat abdominal ct scan - Secondary to alcohol use - clear liquid diet - advance diet as tolerated to low fat. Will consider adding creon once patient tolerating solid food.  H/o alcohol abuse - His last alcoholic drink was more than 1 month ago - as such will not place on CIWA protocol   Code Status: full  DVT Prophylaxis: heparin Family Communication: none at bedside Disposition Plan: Pending improvement in condition.   Time spent: > 35 minutes  Velvet Bathe [facility] Pager [phone]

## 2014-08-16 ENCOUNTER — Observation Stay (HOSPITAL_COMMUNITY): Payer: Self-pay

## 2014-08-16 ENCOUNTER — Observation Stay (HOSPITAL_COMMUNITY)
Admission: AD | Admit: 2014-08-16 | Discharge: 2014-08-16 | Disposition: A | Discharge status: Home / Self Care | Payer: MEDICAID | Source: Other Acute Inpatient Hospital | Attending: Internal Medicine | Admitting: Internal Medicine

## 2014-08-16 DIAGNOSIS — K863 Pseudocyst of pancreas: Secondary | ICD-10-CM | POA: Diagnosis present

## 2014-08-16 DIAGNOSIS — G9340 Encephalopathy, unspecified: Secondary | ICD-10-CM | POA: Diagnosis present

## 2014-08-16 DIAGNOSIS — D649 Anemia, unspecified: Secondary | ICD-10-CM | POA: Diagnosis present

## 2014-08-16 DIAGNOSIS — K859 Acute pancreatitis without necrosis or infection, unspecified: Secondary | ICD-10-CM | POA: Diagnosis present

## 2014-08-16 DIAGNOSIS — F1010 Alcohol abuse, uncomplicated: Secondary | ICD-10-CM | POA: Diagnosis present

## 2014-08-16 LAB — BASIC METABOLIC PANEL
ANION GAP: 10 (ref 5–15)
BUN: 11 mg/dL (ref 6–23)
CALCIUM: 8.4 mg/dL (ref 8.4–10.5)
CO2: 25 mmol/L (ref 19–32)
Chloride: 99 mmol/L (ref 96–112)
Creatinine, Ser: 0.72 mg/dL (ref 0.50–1.35)
GFR calc non Af Amer: >90 mL/min (ref >90)
Glucose, Bld: 94 mg/dL (ref 70–99)
Potassium: 4.3 mmol/L (ref 3.5–5.1)
Sodium: 134 mmol/L — ABNORMAL LOW (ref 135–145)

## 2014-08-16 LAB — RAPID URINE DRUG SCREEN, HOSP PERFORMED
Amphetamines: NOT DETECTED
BENZODIAZEPINES: NOT DETECTED
Barbiturates: NOT DETECTED
Cocaine: NOT DETECTED
Opiates: POSITIVE — AB
Tetrahydrocannabinol: NOT DETECTED

## 2014-08-16 LAB — CBC
HEMATOCRIT: 27 % — AB (ref 39.0–52.0)
Hemoglobin: 9 g/dL — ABNORMAL LOW (ref 13.0–17.0)
MCH: 32.8 pg (ref 26.0–34.0)
MCHC: 33.3 g/dL (ref 30.0–36.0)
MCV: 98.5 fL (ref 78.0–100.0)
Platelets: 844 10*3/uL — ABNORMAL HIGH (ref 150–400)
RBC: 2.74 MIL/uL — ABNORMAL LOW (ref 4.22–5.81)
RDW: 12.2 % (ref 11.5–15.5)
WBC: 16.8 10*3/uL — ABNORMAL HIGH (ref 4.0–10.5)

## 2014-08-16 LAB — AMMONIA: Ammonia: 19 umol/L (ref 11–32)

## 2014-08-16 LAB — LIPASE, BLOOD: LIPASE: 99 U/L — AB (ref 11–59)

## 2014-08-16 MED ORDER — LORAZEPAM 1 MG PO TABS
1.0000 mg | ORAL_TABLET | Freq: Four times a day (QID) | ORAL | Status: AC | PRN
  Administered 2014-08-16 – 2014-08-18 (×3): 1 mg via ORAL
  Filled 2014-08-16 (×3): qty 1

## 2014-08-16 MED ORDER — LORAZEPAM 2 MG/ML IJ SOLN
1.0000 mg | Freq: Four times a day (QID) | INTRAMUSCULAR | Status: DC | PRN
  Administered 2014-08-16 – 2014-08-17 (×2): 1 mg via INTRAVENOUS
  Filled 2014-08-16 (×2): qty 1

## 2014-08-16 MED ORDER — FOLIC ACID 1 MG PO TABS
1.0000 mg | ORAL_TABLET | Freq: Every day | ORAL | Status: DC
  Administered 2014-08-16 – 2014-08-20 (×5): 1 mg via ORAL
  Filled 2014-08-16 (×5): qty 1

## 2014-08-16 MED ORDER — THIAMINE HCL 100 MG/ML IJ SOLN
100.0000 mg | Freq: Every day | INTRAMUSCULAR | Status: DC
  Filled 2014-08-16: qty 1

## 2014-08-16 MED ORDER — ADULT MULTIVITAMIN W/MINERALS CH
1.0000 | ORAL_TABLET | Freq: Every day | ORAL | Status: DC
  Administered 2014-08-16 – 2014-08-20 (×5): 1 via ORAL
  Filled 2014-08-16 (×5): qty 1

## 2014-08-16 MED ORDER — ALTEPLASE 2 MG IJ SOLR
2.0000 mg | Freq: Once | INTRAMUSCULAR | Status: AC
  Administered 2014-08-16: 2 mg
  Filled 2014-08-16: qty 2

## 2014-08-16 MED ORDER — VITAMIN B-1 100 MG PO TABS
100.0000 mg | ORAL_TABLET | Freq: Every day | ORAL | Status: DC
  Administered 2014-08-16 – 2014-08-17 (×2): 100 mg via ORAL
  Filled 2014-08-16 (×2): qty 1

## 2014-08-16 NOTE — Progress Notes (Signed)
Pt has a DL PICC in the RUA. The gray port does not flush. Lawernce Pitts RN to order a CXR to check tip placement. Catalina Pizza

## 2014-08-16 NOTE — Progress Notes (Signed)
[facility] PROGRESS NOTE  Brandon Oneill NLG:[id number] DOB: Jul 01, 1981 DOA: 08/15/2014  PCP: Unknown  Brief HPI: 34 year old Caucasian male who was sent over here from [facility] for further management of pancreatitis and pancreatic pseudocyst. He has a history of alcoholism.  Consultants: Gen. surgery  Procedures: None  Antibiotics: None  Subjective: Patient is noted to be distracted and confused. Noted to be tremulous with occasional myoclonic jerks. Denies any pain  Objective: Vital Signs  Filed Vitals:   08/15/14 1255 08/15/14 2102 08/16/14 0446 08/16/14 1500  BP: 113/61 105/56 106/57 120/64  Pulse: 100 82 88 97  Temp: 98.6 F (37 C) 98.5 F (36.9 C) 98.7 F (37.1 C) 98.3 F (36.8 C)  TempSrc: Oral Oral Oral Oral  Resp: 18 18 16 18   Height: 6' (1.829 m)     Weight: 90.719 kg (200 lb)     SpO2: 100% 98% 98% 97%    Intake/Output Summary (Last 24 hours) at 08/16/14 1652 Last data filed at 08/16/14 1032  Gross per 24 hour  Intake    975 ml  Output   1425 ml  Net   -450 ml   Filed Weights   08/15/14 1255  Weight: 90.719 kg (200 lb)     General appearance: Slow to respond. Appears to be drifting off to sleep at times. Occasionally with myoclonic jerks. In no distress. Resp: clear to auscultation bilaterally Cardio: regular rate and rhythm, S1, S2 normal, no murmur, click, rub or gallop GI: Soft, tender in the upper abdomen without any rebound, rigidity or guarding. No masses. Liver edge is palpable. Bowel sounds are present. Extremities: extremities normal, atraumatic, no cyanosis or edema Neurologic: Easily arousable. Able to maintain conversation for short periods of time. Had difficulty with answering orientation questions. Able to move all his extremities. No other deficits identified.  Lab Results:  Basic Metabolic Panel:  Recent Labs Lab 08/15/14 1647 08/16/14 0440  NA 136 134*  K 4.8 4.3  CL 101 99  CO2 23 25    GLUCOSE 90 94  BUN 12 11  CREATININE 0.69 0.72  CALCIUM 8.9 8.4  MG 1.9  --   PHOS 5.1*  --    Liver Function Tests:  Recent Labs Lab 08/15/14 1647  AST 40*  ALT 40  ALKPHOS 72  BILITOT 0.7  PROT 8.1  ALBUMIN 2.8*    Recent Labs Lab 08/15/14 1647 08/16/14 0440  LIPASE 89* 99*    Recent Labs Lab 08/16/14 0916  AMMONIA 19   CBC:  Recent Labs Lab 08/15/14 1647 08/16/14 0440  WBC 21.0* 16.8*  HGB 9.7* 9.0*  HCT 29.8* 27.0*  MCV 100.3* 98.5  PLT 628* 844*      Studies/Results: Ct Head Wo Contrast  08/16/2014   CLINICAL DATA:  Initial evaluation involuntary body spasms, incoherent, confused, garbled speech  EXAM: CT HEAD WITHOUT CONTRAST  TECHNIQUE: Contiguous axial images were obtained from the base of the skull through the vertex without intravenous contrast.  COMPARISON:  07/29/12  FINDINGS: Mild diffuse atrophy. No abnormal attenuation to suggest mass infarct hemorrhage or extra-axial fluid. No hydrocephalus. Calvarium intact. No inflammatory changes in the visualized sinuses or mastoid air cells. Study mildly limited by motion artifact.  IMPRESSION: No acute abnormalities. Mild atrophy somewhat disproportionate for age.   Electronically Signed   By: Skipper Cliche M.D.   On: 08/16/2014 13:37   Ct Abdomen W Contrast  08/15/2014   CLINICAL DATA:  Recent admission to [facility] for acute pancreatitis. Transferred to [facility] for further evaluation. Suspected pseudo cyst. Abdominal pain.  EXAM: CT ABDOMEN WITH CONTRAST  TECHNIQUE: Multidetector CT imaging of the abdomen was performed using the standard protocol following bolus administration of intravenous contrast.  CONTRAST:  67mL OMNIPAQUE IOHEXOL 300 MG/ML SOLN, 14mL OMNIPAQUE IOHEXOL 300 MG/ML SOLN  COMPARISON:  08/06/2014 from [facility].  FINDINGS: Small bilateral pleural effusions with basilar atelectasis, greater on the left.  Inflammatory infiltration around the pancreas  consistent with history of pancreatitis. Multiple fluid collections are demonstrated in and around the pancreas consistent with pseudo cysts or walled-off necrosis. The largest collection is anterior and superior to the body and tail of the pancreas and deforms the greater curvature of the stomach. This lesion measures about 7.6 x 10.3 cm diameter. Another fluid collection is demonstrated anterior to the body of the pancreas inferiorly and displaces the transverse colon. This collection measures about 7.7 x 6.1 cm. Additional collection medial to the spleen measures about 2.5 x 5.6 cm. Additional collection along the left anterior perirenal space and extending into the pericolic gutter anterior to the psoas muscle. This collection measures 7.2 x 5.3 cm by at least 18 cm in length. The inferior most extension of this fluid collection is not included within the field of view. Fluid collections are better defined than on previous study consistent with maturation of the fluid collections. Pancreatic parenchyma demonstrates homogeneous enhancement. No definite evidence of pancreatic necrosis although the arterial phase images were obtained for best evaluation.  Liver, spleen, gallbladder, adrenal glands, kidneys, abdominal aorta, inferior vena cava, and retroperitoneal lymph nodes appear normal. Stomach is displaced by the pseudo cyst but is not abnormally distended. Visualized small bowel and colon are unremarkable. No free air in the abdomen. Subcutaneous lesion in the anterior abdominal wall subcutaneous fat consistent with sebaceous cyst.  IMPRESSION: Inflammatory changes consistent with acute pancreatitis. Multiple developing fluid collections consistent with developing pseudocysts. Small bilateral pleural effusions and basilar atelectasis, greater on the left.   Electronically Signed   By: Lucienne Capers M.D.   On: 08/15/2014 20:47   Dg Chest Port 1 View  08/16/2014   CLINICAL DATA:  PICC line placement  EXAM:  PORTABLE CHEST - 1 VIEW  COMPARISON:  08/08/2014  FINDINGS: Right arm PICC terminates at the cavoatrial junction.  Lungs are essentially clear. Prominent interstitial marking. No pleural effusion or pneumothorax.  The heart is normal in size.  IMPRESSION: Right arm PICC terminates at the cavoatrial junction.   Electronically Signed   By: Julian Hy M.D.   On: 08/16/2014 10:53    Medications:  Scheduled: . folic acid  1 mg Oral Daily  . heparin  5,000 Units Subcutaneous 3 times per day  . multivitamin with minerals  1 tablet Oral Daily  . thiamine  100 mg Oral Daily   Continuous: . sodium chloride 75 mL/hr at 08/16/14 4098   JXB:JYNWGNFAO **OR** LORazepam, morphine injection, ondansetron **OR** ondansetron (ZOFRAN) IV, oxyCODONE  Assessment/Plan:  Principal Problem:   Pancreatitis, acute Active Problems:   Acute encephalopathy   Pancreatic pseudocyst   Alcohol abuse    Acute Pancreatitis with developing pseudocysts Lipase level is improved. He was in the 100s at [facility]. Ammonia level is normal. Continue full liquid diet. Surgery is following. May need to consult gastroenterology as well.   Acute encephalopathy with myoclonic jerks Etiology remains unclear. Could be related to his alcohol use. CT Head will be obtained. EEG will be  ordered. Continue with thiamine. Ativan as needed.  H/o alcohol abuse His last alcoholic drink was more than 1 month ago. Ativan as needed.  Normocytic anemia Anemia panel the morning. Hemoglobin remained stable.  Thrombocytosis Possibly related to acute inflammation. Continue to monitor  DVT Prophylaxis: Heparin    Code Status: Full code  Family Communication: Discussed with the patient. No family at bedside  Disposition Plan: Not ready for discharge. Check HIV.     LOS: 1 day   [facility] Pager [phone] 08/16/2014, 4:52 PM  If 7PM-7AM, please contact night-coverage at [email or web address], password  [facility]

## 2014-08-16 NOTE — Progress Notes (Signed)
Subjective: Speech is a bit slurred but he seems to be stable.  He has some discomfort.    Objective: Vital signs in last 24 hours: Temp:  [98.5 F (36.9 C)-98.7 F (37.1 C)] 98.7 F (37.1 C) (02/10 0446) Pulse Rate:  [82-100] 88 (02/10 0446) Resp:  [16-18] 16 (02/10 0446) BP: (105-113)/(56-61) 106/57 mmHg (02/10 0446) SpO2:  [98 %-100 %] 98 % (02/10 0446) Weight:  [90.719 kg (200 lb)] 90.719 kg (200 lb) (02/09 1255) Last BM Date: 08/16/14 Diet:  Full liquids Afebrile, VSS Lipase still mildly elevated, WBC 16.8 CT scans shows yesterday shows:  Inflammatory changes consistent with acute pancreatitis. Multiple developing fluid collections consistent with developing pseudocysts. Small bilateral pleural effusions and basilar atelectasis, greater on the left.  Intake/Output from previous day: 02/09 0701 - 02/10 0700 In: 975 [I.V.:975] Out: 1200 [Urine:1200] Intake/Output this shift: Total I/O In: -  Out: 225 [Urine:225]  General appearance: alert, cooperative, slowed mentation and slurred speech GI: soft, mildly distended and a little tender.  Lab Results:   Recent Labs  08/15/14 1647 08/16/14 0440  WBC 21.0* 16.8*  HGB 9.7* 9.0*  HCT 29.8* 27.0*  PLT 628* 844*    BMET  Recent Labs  08/15/14 1647 08/16/14 0440  NA 136 134*  K 4.8 4.3  CL 101 99  CO2 23 25  GLUCOSE 90 94  BUN 12 11  CREATININE 0.69 0.72  CALCIUM 8.9 8.4   PT/INR No results for input(s): LABPROT, INR in the last 72 hours.   Recent Labs Lab 08/15/14 1647  AST 40*  ALT 40  ALKPHOS 72  BILITOT 0.7  PROT 8.1  ALBUMIN 2.8*     Lipase     Component Value Date/Time   LIPASE 99* 08/16/2014 0440     Studies/Results: Ct Abdomen W Contrast  08/15/2014   CLINICAL DATA:  Recent admission to [facility] for acute pancreatitis. Transferred to [facility] for further evaluation. Suspected pseudo cyst. Abdominal pain.  EXAM: CT ABDOMEN WITH CONTRAST  TECHNIQUE: Multidetector  CT imaging of the abdomen was performed using the standard protocol following bolus administration of intravenous contrast.  CONTRAST:  4mL OMNIPAQUE IOHEXOL 300 MG/ML SOLN, 166mL OMNIPAQUE IOHEXOL 300 MG/ML SOLN  COMPARISON:  08/06/2014 from [facility].  FINDINGS: Small bilateral pleural effusions with basilar atelectasis, greater on the left.  Inflammatory infiltration around the pancreas consistent with history of pancreatitis. Multiple fluid collections are demonstrated in and around the pancreas consistent with pseudo cysts or walled-off necrosis. The largest collection is anterior and superior to the body and tail of the pancreas and deforms the greater curvature of the stomach. This lesion measures about 7.6 x 10.3 cm diameter. Another fluid collection is demonstrated anterior to the body of the pancreas inferiorly and displaces the transverse colon. This collection measures about 7.7 x 6.1 cm. Additional collection medial to the spleen measures about 2.5 x 5.6 cm. Additional collection along the left anterior perirenal space and extending into the pericolic gutter anterior to the psoas muscle. This collection measures 7.2 x 5.3 cm by at least 18 cm in length. The inferior most extension of this fluid collection is not included within the field of view. Fluid collections are better defined than on previous study consistent with maturation of the fluid collections. Pancreatic parenchyma demonstrates homogeneous enhancement. No definite evidence of pancreatic necrosis although the arterial phase images were obtained for best evaluation.  Liver, spleen, gallbladder, adrenal glands, kidneys, abdominal aorta, inferior vena cava, and  retroperitoneal lymph nodes appear normal. Stomach is displaced by the pseudo cyst but is not abnormally distended. Visualized small bowel and colon are unremarkable. No free air in the abdomen. Subcutaneous lesion in the anterior abdominal wall subcutaneous fat  consistent with sebaceous cyst.  IMPRESSION: Inflammatory changes consistent with acute pancreatitis. Multiple developing fluid collections consistent with developing pseudocysts. Small bilateral pleural effusions and basilar atelectasis, greater on the left.   Electronically Signed   By: Lucienne Capers M.D.   On: 08/15/2014 20:47    Medications: . folic acid  1 mg Oral Daily  . heparin  5,000 Units Subcutaneous 3 times per day  . multivitamin with minerals  1 tablet Oral Daily  . thiamine  100 mg Oral Daily   Or  . thiamine  100 mg Intravenous Daily    Assessment/Plan Acute pancreatitis ETOH abuse x 10 years   Plan:  No surgical rx at this time.  Rest and observation for now.  If this needs to be drained the best option is to do it endoscopically.    We will follow with you.    LOS: 1 day    Lloyd Cullinan 08/16/2014

## 2014-08-16 NOTE — Progress Notes (Signed)
UR completed 

## 2014-08-16 NOTE — Progress Notes (Signed)
Offsite EEG completed, results pending. 

## 2014-08-16 NOTE — Procedures (Signed)
History: 34 yo M with tremulousness.   Sedation: Lorazepam 1mg  hours before test.   Technique: This is a 17 channel routine scalp EEG performed at the bedside with bipolar and monopolar montages arranged in accordance to the international 10/20 system of electrode placement. One channel was dedicated to EKG recording.    Background: There is a well defined posterior dominant rhythm of  9.5 Hz that attenuates with eye opening. The background consists of intermixed alpha and beta frequencies. There is anterior shifting of the PDR with drowsiness.   Photic stimulation: Physiologic driving is not performed  EEG Abnormalities: none  Clinical Interpretation: This normal EEG is recorded in the waking and drowsy state. There was no seizure or seizure predisposition recorded on this study.   Roland Rack, MD [facility] [phone]  If 7pm- 7am, please page neurology on call as listed in [place].

## 2014-08-16 NOTE — Progress Notes (Signed)
Unable to flush or remove tPA from the PICC line. Dressing removed, and found that the PICC line was kinked under the dressing. The PICC line was straighten, redressed and it flushed with out resistance and gave a good blood return. Primary nurse was updated on line status. Catalina Pizza

## 2014-08-17 ENCOUNTER — Inpatient Hospital Stay (HOSPITAL_COMMUNITY): Payer: Self-pay

## 2014-08-17 ENCOUNTER — Encounter (HOSPITAL_COMMUNITY): Payer: Self-pay | Admitting: Physician Assistant

## 2014-08-17 DIAGNOSIS — K859 Acute pancreatitis without necrosis or infection, unspecified: Secondary | ICD-10-CM | POA: Insufficient documentation

## 2014-08-17 LAB — RETICULOCYTES
RBC.: 2.46 MIL/uL — ABNORMAL LOW (ref 4.22–5.81)
Retic Count, Absolute: 66.4 10*3/uL (ref 19.0–186.0)
Retic Ct Pct: 2.7 % (ref 0.4–3.1)

## 2014-08-17 LAB — COMPREHENSIVE METABOLIC PANEL
ALT: 33 U/L (ref 0–53)
AST: 25 U/L (ref 0–37)
Albumin: 2.4 g/dL — ABNORMAL LOW (ref 3.5–5.2)
Alkaline Phosphatase: 60 U/L (ref 39–117)
Anion gap: 7 (ref 5–15)
BUN: 8 mg/dL (ref 6–23)
CHLORIDE: 102 mmol/L (ref 96–112)
CO2: 25 mmol/L (ref 19–32)
Calcium: 8.5 mg/dL (ref 8.4–10.5)
Creatinine, Ser: 0.73 mg/dL (ref 0.50–1.35)
GFR calc Af Amer: >90 mL/min (ref >90)
GLUCOSE: 101 mg/dL — AB (ref 70–99)
POTASSIUM: 4 mmol/L (ref 3.5–5.1)
Sodium: 134 mmol/L — ABNORMAL LOW (ref 135–145)
Total Bilirubin: 0.5 mg/dL (ref 0.3–1.2)
Total Protein: 7.2 g/dL (ref 6.0–8.3)

## 2014-08-17 LAB — CBC
HCT: 24.2 % — ABNORMAL LOW (ref 39.0–52.0)
Hemoglobin: 7.9 g/dL — ABNORMAL LOW (ref 13.0–17.0)
MCH: 32.1 pg (ref 26.0–34.0)
MCHC: 32.6 g/dL (ref 30.0–36.0)
MCV: 98.4 fL (ref 78.0–100.0)
PLATELETS: 887 10*3/uL — AB (ref 150–400)
RBC: 2.46 MIL/uL — ABNORMAL LOW (ref 4.22–5.81)
RDW: 12.2 % (ref 11.5–15.5)
WBC: 13.7 10*3/uL — ABNORMAL HIGH (ref 4.0–10.5)

## 2014-08-17 LAB — IRON AND TIBC
IRON: 25 ug/dL — AB (ref 42–165)
SATURATION RATIOS: 19 % — AB (ref 20–55)
TIBC: 130 ug/dL — ABNORMAL LOW (ref 215–435)
UIBC: 105 ug/dL — AB (ref 125–400)

## 2014-08-17 LAB — FOLATE: FOLATE: 11.8 ng/mL

## 2014-08-17 LAB — VITAMIN B12: Vitamin B-12: 1971 pg/mL — ABNORMAL HIGH (ref 211–911)

## 2014-08-17 LAB — LIPASE, BLOOD: LIPASE: 96 U/L — AB (ref 11–59)

## 2014-08-17 LAB — FERRITIN: Ferritin: 1157 ng/mL — ABNORMAL HIGH (ref 22–322)

## 2014-08-17 LAB — TSH: TSH: 1.605 u[IU]/mL (ref 0.350–4.500)

## 2014-08-17 MED ORDER — SODIUM CHLORIDE 0.9 % IV SOLN
500.0000 mg | Freq: Three times a day (TID) | INTRAVENOUS | Status: AC
  Administered 2014-08-17 – 2014-08-19 (×8): 500 mg via INTRAVENOUS
  Filled 2014-08-17 (×8): qty 5

## 2014-08-17 MED ORDER — VITAMIN B-1 100 MG PO TABS: 100.0000 mg | ORAL_TABLET | Freq: Every day | ORAL | Status: DC

## 2014-08-17 NOTE — Progress Notes (Signed)
[facility] PROGRESS NOTE  Brandon Oneill FRT:[id number] DOB: 03-04-1981 DOA: 08/15/2014  PCP: Unknown  Brief HPI: 34 year old Caucasian male who was sent over here from [facility] for further management of pancreatitis and pancreatic pseudocyst. He has a history of alcoholism.  Consultants: Gen. Surgery, GI, Neurology, Psychiatry  Procedures: None  Antibiotics: None  Subjective: Patient is more awake and alert today. Still somewhat distracted. Pain in the abdomen is improved. Denies any nausea, vomiting.   Objective: Vital Signs  Filed Vitals:   08/16/14 0446 08/16/14 1500 08/16/14 2056 08/17/14 0500  BP: 106/57 120/64 116/66 125/73  Pulse: 88 97 112 104  Temp: 98.7 F (37.1 C) 98.3 F (36.8 C) 98.3 F (36.8 C) 98.5 F (36.9 C)  TempSrc: Oral Oral Oral Oral  Resp: 16 18 20 20   Height:      Weight:      SpO2: 98% 97% 98% 98%    Intake/Output Summary (Last 24 hours) at 08/17/14 0936 Last data filed at 08/17/14 0459  Gross per 24 hour  Intake      0 ml  Output    900 ml  Net   -900 ml   Filed Weights   08/15/14 1255  Weight: 90.719 kg (200 lb)     General appearance: Slow to respond. Appears to be drifting off to sleep at times. Occasionally with myoclonic jerks. In no distress. Resp: clear to auscultation bilaterally Cardio: regular rate and rhythm, S1, S2 normal, no murmur, click, rub or gallop GI: Soft, less tender today in the upper abdomen without any rebound, rigidity or guarding. No masses. Liver edge is palpable. Bowel sounds are present. Extremities: extremities normal, atraumatic, no cyanosis or edema Neurologic: Awake and alert. More conversational today. Oriented 3. No focal deficits. No jerking movements noted today. But nurse told me that he has been having them.   Lab Results:  Basic Metabolic Panel:  Recent Labs Lab 08/15/14 1647 08/16/14 0440 08/17/14 0507  NA 136 134* 134*  K 4.8 4.3 4.0  CL 101 99 102    CO2 23 25 25   GLUCOSE 90 94 101*  BUN 12 11 8   CREATININE 0.69 0.72 0.73  CALCIUM 8.9 8.4 8.5  MG 1.9  --   --   PHOS 5.1*  --   --    Liver Function Tests:  Recent Labs Lab 08/15/14 1647 08/17/14 0507  AST 40* 25  ALT 40 33  ALKPHOS 72 60  BILITOT 0.7 0.5  PROT 8.1 7.2  ALBUMIN 2.8* 2.4*    Recent Labs Lab 08/15/14 1647 08/16/14 0440 08/17/14 0507  LIPASE 89* 99* 96*    Recent Labs Lab 08/16/14 0916  AMMONIA 19   CBC:  Recent Labs Lab 08/15/14 1647 08/16/14 0440 08/17/14 0507  WBC 21.0* 16.8* 13.7*  HGB 9.7* 9.0* 7.9*  HCT 29.8* 27.0* 24.2*  MCV 100.3* 98.5 98.4  PLT 628* 844* 887*      Studies/Results: Ct Head Wo Contrast  08/16/2014   CLINICAL DATA:  Initial evaluation involuntary body spasms, incoherent, confused, garbled speech  EXAM: CT HEAD WITHOUT CONTRAST  TECHNIQUE: Contiguous axial images were obtained from the base of the skull through the vertex without intravenous contrast.  COMPARISON:  07/29/12  FINDINGS: Mild diffuse atrophy. No abnormal attenuation to suggest mass infarct hemorrhage or extra-axial fluid. No hydrocephalus. Calvarium intact. No inflammatory changes in the visualized sinuses or mastoid air cells. Study mildly limited by motion artifact.  IMPRESSION: No acute abnormalities. Mild  atrophy somewhat disproportionate for age.   Electronically Signed   By: Skipper Cliche M.D.   On: 08/16/2014 13:37   Ct Abdomen W Contrast  08/15/2014   CLINICAL DATA:  Recent admission to [facility] for acute pancreatitis. Transferred to [facility] for further evaluation. Suspected pseudo cyst. Abdominal pain.  EXAM: CT ABDOMEN WITH CONTRAST  TECHNIQUE: Multidetector CT imaging of the abdomen was performed using the standard protocol following bolus administration of intravenous contrast.  CONTRAST:  38mL OMNIPAQUE IOHEXOL 300 MG/ML SOLN, 143mL OMNIPAQUE IOHEXOL 300 MG/ML SOLN  COMPARISON:  08/06/2014 from [facility].   FINDINGS: Small bilateral pleural effusions with basilar atelectasis, greater on the left.  Inflammatory infiltration around the pancreas consistent with history of pancreatitis. Multiple fluid collections are demonstrated in and around the pancreas consistent with pseudo cysts or walled-off necrosis. The largest collection is anterior and superior to the body and tail of the pancreas and deforms the greater curvature of the stomach. This lesion measures about 7.6 x 10.3 cm diameter. Another fluid collection is demonstrated anterior to the body of the pancreas inferiorly and displaces the transverse colon. This collection measures about 7.7 x 6.1 cm. Additional collection medial to the spleen measures about 2.5 x 5.6 cm. Additional collection along the left anterior perirenal space and extending into the pericolic gutter anterior to the psoas muscle. This collection measures 7.2 x 5.3 cm by at least 18 cm in length. The inferior most extension of this fluid collection is not included within the field of view. Fluid collections are better defined than on previous study consistent with maturation of the fluid collections. Pancreatic parenchyma demonstrates homogeneous enhancement. No definite evidence of pancreatic necrosis although the arterial phase images were obtained for best evaluation.  Liver, spleen, gallbladder, adrenal glands, kidneys, abdominal aorta, inferior vena cava, and retroperitoneal lymph nodes appear normal. Stomach is displaced by the pseudo cyst but is not abnormally distended. Visualized small bowel and colon are unremarkable. No free air in the abdomen. Subcutaneous lesion in the anterior abdominal wall subcutaneous fat consistent with sebaceous cyst.  IMPRESSION: Inflammatory changes consistent with acute pancreatitis. Multiple developing fluid collections consistent with developing pseudocysts. Small bilateral pleural effusions and basilar atelectasis, greater on the left.   Electronically  Signed   By: Lucienne Capers M.D.   On: 08/15/2014 20:47   Dg Chest Port 1 View  08/16/2014   CLINICAL DATA:  PICC line placement  EXAM: PORTABLE CHEST - 1 VIEW  COMPARISON:  08/08/2014  FINDINGS: Right arm PICC terminates at the cavoatrial junction.  Lungs are essentially clear. Prominent interstitial marking. No pleural effusion or pneumothorax.  The heart is normal in size.  IMPRESSION: Right arm PICC terminates at the cavoatrial junction.   Electronically Signed   By: Julian Hy M.D.   On: 08/16/2014 10:53    Medications:  Scheduled: . folic acid  1 mg Oral Daily  . heparin  5,000 Units Subcutaneous 3 times per day  . multivitamin with minerals  1 tablet Oral Daily  . thiamine  100 mg Oral Daily   Continuous: . sodium chloride 75 mL/hr at 08/17/14 0220   DJM:[id number] **OR** [DISCONTINUED] LORazepam, morphine injection, ondansetron **OR** ondansetron (ZOFRAN) IV, oxyCODONE  Assessment/Plan:  Principal Problem:   Pancreatitis, acute Active Problems:   Acute encephalopathy   Pancreatic pseudocyst   Alcohol abuse   Normocytic anemia    Acute Alcoholic Pancreatitis with developing pseudocysts Lipase level is improved. It was in the 100s at  [facility]. Ammonia level is normal. Continue full liquid diet. Surgery has been following. No surgical intervention planned. We will consult gastroenterology to get their opinion regarding the pseudocysts.   Acute encephalopathy with myoclonic jerks Etiology remains unclear. Could be related to his alcohol use/wernicke. CT Head was unremarkable. EEG did not show any epileptic activity. Continue with thiamine. Cut back on sedation. We'll consult neurology to assist. HIV, RPR, TSH pending.  H/o alcohol abuse His last alcoholic drink was more than 1 month ago. Ativan as needed. He had admitted to some suicidal ideation over at [facility]. He was seen by psychiatry there and this was not thought to be a significant issue. He  has not mentioned these ideations here. We will get psychiatry input regarding his mental status and alcoholism.  Normocytic anemia Anemia panel reviewed. No iron deficiency. Hemoglobin is lower today compared to yesterday. Could be due to dilution. Repeat in the morning. No overt bleeding. Vitamin I-01 and folic acid were also not deficient.  Thrombocytosis Possibly related to acute inflammation. Continue to monitor  DVT Prophylaxis: Heparin    Code Status: Full code  Family Communication: Discussed with the patient. No family at bedside  Disposition Plan: Not ready for discharge. Mobilize.     LOS: 2 days   [place] [facility] Pager [phone] 08/17/2014, 9:36 AM  If 7PM-7AM, please contact night-coverage at [email or web address], password [facility]

## 2014-08-17 NOTE — Progress Notes (Signed)
CSW received referral that pt seeking alcohol rehab.   Per chart, pt alert and oriented x 2. Per MD note, psych has been consulted for input regarding pt mental status and alcoholism.   CSW to await psych MD recommendations in order to further assist with appropriate options for pt upon discharge.  CSW to continue to follow.  Alison Murray, MSW, [employer] [phone]

## 2014-08-17 NOTE — Consult Note (Signed)
Referring Provider:  [facility] Primary Care Physician:  No primary care provider on file. Primary Gastroenterologist:  unassigned  Reason for Consultation:   pancreatitis     HPI: Brandon Oneill is a 34 y.o. male who was recently admitted to [facility] approximately 3-3-1/2 weeks ago with abdominal pain. He has a history of abusing alcohol for approximately the past 10-12 years. He recently had an admission in the [facility] October 14-17 of 2015 for alcohol-induced pancreatitis. While in [facility] he was found to have pancreatitis. CT done January 25 showed pancreatitis with some free inflammatory fluid in the abdomen. There was no evidence of pancreatic necrosis or pseudocyst. He initially was scheduled for transfer to [facility] but ended up getting transferred to [facility]. He has been seen by surgery and on February 9 had a repeat CT that showed inflammatory changes consistent with pancreatitis as well as multiple developing fluid collections consistent with developing pseudocyst. He also had small bilateral pleural effusions and basilar atelectasis. Patient spends encephalopathic since admission though he is more awake today. He had an EEG on the 10th that showed no seizure or seizure predisposition. His ammonia level on February 10 was 49   PMH-Pt has had prior episodes of alcohol induced pancreatitis.  History reviewed. No pertinent past surgical history.  Prior to Admission medications   Not on File    Current Facility-Administered Medications  Medication Dose Route Frequency Provider Last Rate Last Dose  . 0.9 %  sodium chloride infusion   Intravenous Continuous Velvet Bathe, MD 75 mL/hr at 08/17/14 0220    . folic acid (FOLVITE) tablet 1 mg  1 mg Oral Daily Rhetta Mura Schorr, NP   1 mg at 08/17/14 0941  . heparin injection 5,000 Units  5,000 Units Subcutaneous 3 times per day Velvet Bathe, MD   5,000 Units at 08/17/14 0510   . LORazepam (ATIVAN) tablet 1 mg  1 mg Oral Q6H PRN Jeryl Columbia, NP   1 mg at 08/16/14 0509  . morphine 2 MG/ML injection 2 mg  2 mg Intravenous Q4H PRN Velvet Bathe, MD   2 mg at 08/16/14 0014  . multivitamin with minerals tablet 1 tablet  1 tablet Oral Daily Rhetta Mura Schorr, NP   1 tablet at 08/17/14 0941  . ondansetron (ZOFRAN) tablet 4 mg  4 mg Oral Q6H PRN Velvet Bathe, MD       Or  . ondansetron (ZOFRAN) injection 4 mg  4 mg Intravenous Q6H PRN Velvet Bathe, MD      . oxyCODONE (Oxy IR/ROXICODONE) immediate release tablet 5-10 mg  5-10 mg Oral Q4H PRN Velvet Bathe, MD   10 mg at 08/17/14 0817  . thiamine (VITAMIN B-1) tablet 100 mg  100 mg Oral Daily Rhetta Mura Schorr, NP   100 mg at 08/17/14 0941    Allergies as of 08/15/2014 - Review Complete 08/15/2014  Allergen Reaction Noted  . Erythromycin Rash 08/15/2014    History reviewed. No pertinent family history.    Review of Systems:  Pt is drowsy, but arousable, speaks in circles about past events, but does not answer questions.  Physical Exam: Vital signs in last 24 hours: Temp:  [98.3 F (36.8 C)-98.5 F (36.9 C)] 98.5 F (36.9 C) (02/11 0500) Pulse Rate:  [97-112] 104 (02/11 0500) Resp:  [18-20] 20 (02/11 0500) BP: (116-125)/(64-73) 125/73 mmHg (02/11 0500) SpO2:  [97 %-98 %] 98 % (02/11 0500) Last BM Date: 08/16/14  General:   Alert,  Well-developed, well-nourished,  in NAD. Occasional myoclonic jerks Head:  Normocephalic and atraumatic. Eyes:  Sclera clear, no icterus.   Conjunctiva pink. Ears:  Normal auditory acuity. Nose:  No deformity, discharge,  or lesions. Mouth:  No deformity or lesions.   Neck:  Supple; no masses or thyromegaly. Lungs:  Clear throughout to auscultation.     Heart:  Regular rate and rhythm; no murmurs, clicks, rubs,  or gallops. Abdomen:  Soft,mild upper abdominal tenderness, mildly distebded,BS active,liver edge palpable   Rectal:  Deferred  Msk:  Symmetrical without gross  deformities. . Pulses:  Normal pulses noted. Extremities:  Without clubbing or edema. Neurologic:  Slow mentation and slurred speech Skin:  Intact without significant lesions or rashes..   Intake/Output from previous day: 02/10 0701 - 02/11 0700 In: -  Out: 900 [Urine:900] Intake/Output this shift: Total I/O In: 480 [P.O.:480] Out: 700 [Urine:700]  Lab Results:  Recent Labs  08/15/14 1647 08/16/14 0440 08/17/14 0507  WBC 21.0* 16.8* 13.7*  HGB 9.7* 9.0* 7.9*  HCT 29.8* 27.0* 24.2*  PLT 628* 844* 887*   BMET  Recent Labs  08/15/14 1647 08/16/14 0440 08/17/14 0507  NA 136 134* 134*  K 4.8 4.3 4.0  CL 101 99 102  CO2 23 25 25   GLUCOSE 90 94 101*  BUN 12 11 8   CREATININE 0.69 0.72 0.73  CALCIUM 8.9 8.4 8.5   LFT  Recent Labs  08/17/14 0507  PROT 7.2  ALBUMIN 2.4*  AST 25  ALT 33  ALKPHOS 60  BILITOT 0.5   Lipase on 08/17/14  96, on February 10 was 99, on February 9 was 89. Ammonia on February 10 was 19   Studies/Results: Ct Head Wo Contrast  08/16/2014   CLINICAL DATA:  Initial evaluation involuntary body spasms, incoherent, confused, garbled speech  EXAM: CT HEAD WITHOUT CONTRAST  TECHNIQUE: Contiguous axial images were obtained from the base of the skull through the vertex without intravenous contrast.  COMPARISON:  07/29/12  FINDINGS: Mild diffuse atrophy. No abnormal attenuation to suggest mass infarct hemorrhage or extra-axial fluid. No hydrocephalus. Calvarium intact. No inflammatory changes in the visualized sinuses or mastoid air cells. Study mildly limited by motion artifact.  IMPRESSION: No acute abnormalities. Mild atrophy somewhat disproportionate for age.   Electronically Signed   By: Skipper Cliche M.D.   On: 08/16/2014 13:37   Ct Abdomen W Contrast  08/15/2014   CLINICAL DATA:  Recent admission to [facility] for acute pancreatitis. Transferred to [facility] for further evaluation. Suspected pseudo cyst. Abdominal pain.  EXAM:  CT ABDOMEN WITH CONTRAST  TECHNIQUE: Multidetector CT imaging of the abdomen was performed using the standard protocol following bolus administration of intravenous contrast.  CONTRAST:  59mL OMNIPAQUE IOHEXOL 300 MG/ML SOLN, 174mL OMNIPAQUE IOHEXOL 300 MG/ML SOLN  COMPARISON:  08/06/2014 from [facility]  FINDINGS: Small bilateral pleural effusions with basilar atelectasis, greater on the left.  Inflammatory infiltration around the pancreas consistent with history of pancreatitis. Multiple fluid collections are demonstrated in and around the pancreas consistent with pseudo cysts or walled-off necrosis. The largest collection is anterior and superior to the body and tail of the pancreas and deforms the greater curvature of the stomach. This lesion measures about 7.6 x 10.3 cm diameter. Another fluid collection is demonstrated anterior to the body of the pancreas inferiorly and displaces the transverse colon. This collection measures about 7.7 x 6.1 cm. Additional collection medial to the spleen measures  about 2.5 x 5.6 cm. Additional collection along the left anterior perirenal space and extending into the pericolic gutter anterior to the psoas muscle. This collection measures 7.2 x 5.3 cm by at least 18 cm in length. The inferior most extension of this fluid collection is not included within the field of view. Fluid collections are better defined than on previous study consistent with maturation of the fluid collections. Pancreatic parenchyma demonstrates homogeneous enhancement. No definite evidence of pancreatic necrosis although the arterial phase images were obtained for best evaluation.  Liver, spleen, gallbladder, adrenal glands, kidneys, abdominal aorta, inferior vena cava, and retroperitoneal lymph nodes appear normal. Stomach is displaced by the pseudo cyst but is not abnormally distended. Visualized small bowel and colon are unremarkable. No free air in the abdomen. Subcutaneous lesion in  the anterior abdominal wall subcutaneous fat consistent with sebaceous cyst.  IMPRESSION: Inflammatory changes consistent with acute pancreatitis. Multiple developing fluid collections consistent with developing pseudocysts. Small bilateral pleural effusions and basilar atelectasis, greater on the left.   Electronically Signed   By: Lucienne Capers M.D.   On: 08/15/2014 20:47   Dg Chest Port 1 View  08/16/2014   CLINICAL DATA:  PICC line placement  EXAM: PORTABLE CHEST - 1 VIEW  COMPARISON:  08/08/2014  FINDINGS: Right arm PICC terminates at the cavoatrial junction.  Lungs are essentially clear. Prominent interstitial marking. No pleural effusion or pneumothorax.  The heart is normal in size.  IMPRESSION: Right arm PICC terminates at the cavoatrial junction.   Electronically Signed   By: Julian Hy M.D.   On: 08/16/2014 10:53    IMPRESSION/PLAN: 34 year old male with a history of alcohol abuse for the past 10+ years admitted with acute pancreatitis and pseudocyst formation. Patient has been improving on his current regimen. Will continue timing, and folic acid along with lorazepam. Patient has been evaluated by surgery and is not a candidate for surgery at this time as the pseudocyst is not yet mature. Endoscopic drainage currently not available at this facility. Would continue current treatment plan. Will review with Dr. Olevia Perches.   Brandon Oneill, Deloris Ping 08/17/2014,  Pager [phone]

## 2014-08-17 NOTE — Consult Note (Signed)
Neurology Consultation Reason for Consult: AlteredMental Status Referring Physician: Curly Rim  CC: Altered Mental Status  History is obtained from:patient  HPI: Brandon Oneill is a 34 y.o. male with a history of etoh abuse who presented to [facility] with pancreatitis. It has been noted since admissino here that he was having myoclonus and altered mental status. He states taht he has not had any double vision, but has had "blurred vision" that gets better with closing either eye. He also notes it is worse with looking to the right. He has not had any difficulty with walking.   He endorses belly pain.   ROS: A 14 point ROS was performed and is negative except as noted in the HPI.    Past Medical History  Diagnosis Date  . Acute alcoholic pancreatitis 81/1572    CT 08/15/14 shows evolving psedocyts  . Alcoholism 08/2014  . Anemia 08/2014    macrocytic    Family History: +substance abuse  Social History: Tob: current smoker  Exam: Current vital signs: BP 119/65 mmHg  Pulse 109  Temp(Src) 98.2 F (36.8 C) (Oral)  Resp 20  Ht 6' (1.829 m)  Wt 90.719 kg (200 lb)  BMI 27.12 kg/m2  SpO2 98% Vital signs in last 24 hours: Temp:  [98.2 F (36.8 C)-98.5 F (36.9 C)] 98.2 F (36.8 C) (02/11 1422) Pulse Rate:  [104-112] 109 (02/11 1422) Resp:  [20] 20 (02/11 1422) BP: (116-125)/(65-73) 119/65 mmHg (02/11 1422) SpO2:  [98 %] 98 % (02/11 1422)   Physical Exam  Constitutional: Appears well-developed and well-nourished.  Psych: Affect appropriate to situation Eyes: No scleral injection HENT: No OP obstrucion Head: Normocephalic.  Cardiovascular: Normal rate and regular rhythm.  Respiratory: Effort normal and breath sounds normal to anterior ascultation GI: Soft.  No distension. There is no tenderness.  Skin: WDI  Neuro: Mental Status: Patient is awake, alert, oriented to person, place, month,  and situation. He gives year as 2017. He is unable to spell world  backwards, gives 100 - 7 as "negative one hundred" answers how many quarters are in $2.75 with "9" No signs of aphasia or neglect Cranial Nerves: II: Visual Fields are full. Pupils are equal, round, and reactive to light.   III,IV, VI: EOMI without ptosis. I was unable to appreciate ophthalmoplegia, but he endorses blurred vision with rightward gaze.  V: Facial sensation is symmetric to temperature VII: Facial movement is symmetric.  VIII: hearing is intact to voice X: Uvula elevates symmetrically XI: Shoulder shrug is symmetric. XII: tongue is midline without atrophy or fasciculations.  Motor: Tone is normal. Bulk is normal. 5/5 strength was present in all four extremities.  Sensory: Sensation is symmetric to light touch and temperature in the arms and legs. Deep Tendon Reflexes: 2+ and symmetric in the biceps and patellae.  Plantars: Toes are downgoing bilaterally.  Cerebellar: FNF intact Romberg negative    I have reviewed labs in epic and the results pertinent to this consultation are: Ammonia 12   I have reviewed the images obtained:CT head - negative acute  Impression: 34 yo M with ams in the setting of pancreatitis from EtOH. I think that the myoclonus would argue strongly for a toxic/metabolic source of his AMS, with the most likely being pancreatic encephalopathy vs wernicke's encephaloapthy. Given his history, I would favor high dose thiamine. Also MRI may be helpful.   Recommendations: 1) MRI brain 2) thiamine 500mg  IV TID x 3 days.  3) will continue to follow.  4)  TSH, B12   Roland Rack, MD [facility] [phone]  If 7pm- 7am, please page neurology on call as listed in [place].

## 2014-08-18 ENCOUNTER — Encounter (HOSPITAL_COMMUNITY): Payer: Self-pay | Admitting: Physician Assistant

## 2014-08-18 DIAGNOSIS — F10221 Alcohol dependence with intoxication delirium: Secondary | ICD-10-CM

## 2014-08-18 LAB — BASIC METABOLIC PANEL
ANION GAP: 7 (ref 5–15)
BUN: 5 mg/dL — ABNORMAL LOW (ref 6–23)
CHLORIDE: 106 mmol/L (ref 96–112)
CO2: 24 mmol/L (ref 19–32)
Calcium: 8.3 mg/dL — ABNORMAL LOW (ref 8.4–10.5)
Creatinine, Ser: 0.64 mg/dL (ref 0.50–1.35)
GFR calc non Af Amer: >90 mL/min (ref >90)
Glucose, Bld: 111 mg/dL — ABNORMAL HIGH (ref 70–99)
Potassium: 3.9 mmol/L (ref 3.5–5.1)
SODIUM: 137 mmol/L (ref 135–145)

## 2014-08-18 LAB — VITAMIN B12: Vitamin B-12: >2000 pg/mL — ABNORMAL HIGH (ref 211–911)

## 2014-08-18 LAB — CBC
HEMATOCRIT: 24.7 % — AB (ref 39.0–52.0)
HEMOGLOBIN: 8.2 g/dL — AB (ref 13.0–17.0)
MCH: 32.5 pg (ref 26.0–34.0)
MCHC: 33.2 g/dL (ref 30.0–36.0)
MCV: 98 fL (ref 78.0–100.0)
Platelets: 829 10*3/uL — ABNORMAL HIGH (ref 150–400)
RBC: 2.52 MIL/uL — ABNORMAL LOW (ref 4.22–5.81)
RDW: 12.2 % (ref 11.5–15.5)
WBC: 11.4 10*3/uL — ABNORMAL HIGH (ref 4.0–10.5)

## 2014-08-18 LAB — HIV ANTIBODY (ROUTINE TESTING W REFLEX): HIV Screen 4th Generation wRfx: NONREACTIVE

## 2014-08-18 LAB — LIPASE, BLOOD: LIPASE: 105 U/L — AB (ref 11–59)

## 2014-08-18 LAB — RPR: RPR: NONREACTIVE

## 2014-08-18 NOTE — Evaluation (Signed)
Physical Therapy Evaluation Patient Details Name: Brandon Oneill MRN: [id number] DOB: [birth year] Today's Date: 08/18/2014   History of Present Illness  34 y.o. male with h/o alcohol abuse recent admission at [facility] to acute pancreatitis. Patient was transferred here for further evaluation and recommendations for suspected pseudocyst.  Clinical Impression  *Pt admitted with above diagnosis. Pt currently with functional limitations due to the deficits listed below (see PT Problem List).  Pt will benefit from skilled PT to increase their independence and safety with mobility to allow discharge to the venue listed below.    Pt requires min guard to min assist for balance with walking as he tends to stagger and has decreased safety awareness. He walked 200' without an assistive device, RW was recommended however pt refused it. He was unable to provide detailed information on level of assistance available upon DC. He stated he lost his job and apartment. He is hoping to go to [facility] rehab.  **    Follow Up Recommendations Supervision for mobility/OOB    Equipment Recommendations  None recommended by PT    Recommendations for Other Services OT consult     Precautions / Restrictions Precautions Precautions: Fall Restrictions Weight Bearing Restrictions: No      Mobility  Bed Mobility Overal bed mobility: Needs Assistance Bed Mobility: Supine to Sit     Supine to sit: Min assist     General bed mobility comments: min A to raise trunk  Transfers Overall transfer level: Needs assistance Equipment used: None Transfers: Sit to/from Stand Sit to Stand: Min guard;Min assist         General transfer comment: min guard to min A for balance upon standing  Ambulation/Gait Ambulation/Gait assistance: Min assist Ambulation Distance (Feet): 200 Feet Assistive device: None Gait Pattern/deviations: Drifts right/left;Narrow base of support   Gait velocity  interpretation: at or above normal speed for age/gender General Gait Details: LOB x 2 requiring min A, pt refused use of RW, decreased awareness of deficits, pt tends to drift L and R  Stairs            Wheelchair Mobility    Modified Rankin (Stroke Patients Only)       Balance Overall balance assessment: Needs assistance   Sitting balance-Leahy Scale: Good       Standing balance-Leahy Scale: Fair                               Pertinent Vitals/Pain Pain Assessment: 0-10 Pain Score: 8  Pain Location: abdomen Pain Intervention(s): Monitored during session;Patient requesting pain meds-RN notified    Home Living Family/patient expects to be discharged to:: Unsure Living Arrangements: Alone               Additional Comments: Pt stated he recently lost his job and apartment. He stated he'd become a burden to his family. He is hoping to go to [facility] rehab upon DC then get a job and apartment.    Prior Function Level of Independence: Independent               Hand Dominance        Extremity/Trunk Assessment   Upper Extremity Assessment: Overall WFL for tasks assessed           Lower Extremity Assessment: Overall WFL for tasks assessed      Cervical / Trunk Assessment: Normal  Communication   Communication: Expressive difficulties (speech occaissionally  slurred)  Cognition Arousal/Alertness: Awake/alert Behavior During Therapy: Impulsive;Anxious Overall Cognitive Status: No family/caregiver present to determine baseline cognitive functioning (pt aware he's in [place], didn't know name of hospital but recalled being transferred here from [place]. Somewhat impulsive behavior with decreased safety awareness/decreased awareness of deficits. )                      General Comments      Exercises        Assessment/Plan    PT Assessment Patient needs continued PT services  PT Diagnosis Abnormality of gait;Difficulty  walking;Acute pain;Altered mental status   PT Problem List Decreased balance;Decreased mobility;Decreased coordination;Decreased cognition;Decreased safety awareness  PT Treatment Interventions Functional mobility training;Therapeutic activities;Balance training;Patient/family education   PT Goals (Current goals can be found in the Care Plan section) Acute Rehab PT Goals Patient Stated Goal: to get to rehab for Et[facility] PT Goal Formulation: With patient Time For Goal Achievement: 09/01/14 Potential to Achieve Goals: Fair    Frequency Min 3X/week   Barriers to discharge Decreased caregiver support at present he requires min assist for mobility due to fall risk, unclear if he has support available     Co-evaluation               End of Session Equipment Utilized During Treatment: Gait belt Activity Tolerance: Patient tolerated treatment well Patient left: in chair;with call bell/phone within reach;with chair alarm set Nurse Communication: Mobility status         Time: 7681-1572 PT Time Calculation (min) (ACUTE ONLY): 18 min   Charges:   PT Evaluation $Initial PT Evaluation Tier I: 1 Procedure     PT G CodesPhilomena Doheny 08/18/2014, 10:05 AM [phone]

## 2014-08-18 NOTE — Care Management Note (Signed)
CARE MANAGEMENT NOTE 08/18/2014  Patient:  Brandon Oneill, Brandon Oneill   Account Number:  [id number]  Date Initiated:  08/18/2014  Documentation initiated by:  Marney Doctor  Subjective/Objective Assessment:   34 yo admitted with pancreatitis     Action/Plan:   From home alone   Anticipated DC Date:  08/19/2014   Anticipated DC Plan:  [place]  CM consult      Choice offered to / List presented to:             Status of service:  Completed, signed off Medicare Important Message given?   (If response is "NO", the following Medicare IM given date fields will be blank) Date Medicare IM given:   Medicare IM given by:   Date Additional Medicare IM given:   Additional Medicare IM given by:    Discharge Disposition:    Per UR Regulation:  Reviewed for med. necessity/level of care/duration of stay  If discussed at [place] of Stay Meetings, dates discussed:    Comments:  08/18/14 Marney Doctor RN,BSN,NCM [phone] Chart reviewed and no CM needs identified.

## 2014-08-18 NOTE — Evaluation (Signed)
Occupational Therapy Evaluation Patient Details Name: Brandon Oneill MRN: [id number] DOB: 1981-03-16 Today's Date: 08/18/2014    History of Present Illness 34 y.o. male with h/o alcohol abuse recent admission at [facility] to acute pancreatitis. Patient was transferred here for further evaluation and recommendations for suspected pseudocyst.   Clinical Impression   Pt was admitted for the above.  Will follow in acute setting to increase safety and independence with adls.  Pt was independent prior to admission.  Currently, min guard is needed for safety with mobility related to adls.  Goals in acute are for mod I.      Follow Up Recommendations   (Per chart, pt is planning residential substance abuse program)    Equipment Recommendations  None recommended by OT (likely)    Recommendations for Other Services       Precautions / Restrictions Precautions Precautions: Fall Restrictions Weight Bearing Restrictions: No      Mobility Bed Mobility Overal bed mobility: Needs Assistance           General bed mobility comments: pt raised HOB and brought legs over side of bed (mod I)  Transfers Overall transfer level: Needs assistance Equipment used: None Transfers: Sit to/from Stand Sit to Stand: Min guard          General transfer comment: for safety    Balance Overall balance assessment: Needs assistance   Sitting balance-Leahy Scale: Good       Standing balance-Leahy Scale: Fair                              ADL Overall ADL's : Needs assistance/impaired     Grooming: Standing;Supervision/safety                                 General ADL Comments: ambulated in room with min guard for safety:  pt tends to move quickly.  No LOB noted.  Pt crossed legs to don socks.  Reports abdominal pain.  Raised HOB to get OOB.  Educated that he can also roll over and get up from sidelying to minimize pain     Vision   Did not test;  Per notes, pt has blurry vision   Perception     Praxis      Pertinent Vitals/Pain Pain Assessment: 0-10 Pain Score: 8  Pain Location: abdomen Pain Intervention(s): Limited activity within patient's tolerance;Monitored during session;Repositioned     Hand Dominance     Extremity/Trunk Assessment Upper Extremity Assessment Upper Extremity Assessment: Overall WFL for tasks assessed   Lower Extremity Assessment Lower Extremity Assessment: Overall WFL for tasks assessed   Cervical / Trunk Assessment Cervical / Trunk Assessment: Normal   Communication Communication Communication: No difficulties   Cognition Arousal/Alertness: Awake/alert Behavior During Therapy: Impulsive;Anxious (reports feeling depressed and hopeless):  Spoke to RN Overall Cognitive Status: No family/caregiver present to determine baseline cognitive functioning (pt moves quickly; verbalizes that he is weaker than normal)                     General Comments       Exercises       Shoulder Instructions      Home Living Family/patient expects to be discharged to:: Unsure Living Arrangements: Alone  Additional Comments: Pt stated he recently lost his job and apartment. He stated he'd become a burden to his family. He is hoping to go to [facility] rehab upon DC then get a job and apartment.      Prior Functioning/Environment Level of Independence: Independent             OT Diagnosis: Generalized weakness   OT Problem List: Decreased strength;Decreased activity tolerance;Impaired balance (sitting and/or standing);Pain   OT Treatment/Interventions: Self-care/ADL training;DME and/or AE instruction;Patient/family education;Balance training    OT Goals(Current goals can be found in the care plan section) Acute Rehab OT Goals Patient Stated Goal: get rid of this depression, stop drinking and get another job OT Goal Formulation: With  patient Time For Goal Achievement: 08/25/14 Potential to Achieve Goals: Good ADL Goals Pt Will Transfer to Toilet: with modified independence;ambulating;regular height toilet Additional ADL Goal #1: pt will gather clothes at mod I level and complete ADL at this level  OT Frequency: Min 2X/week   Barriers to D/C:            Co-evaluation              End of Session    Activity Tolerance: Patient tolerated treatment well Patient left: in bed;with call bell/phone within reach;with bed alarm set   Time: 1443-1540 OT Time Calculation (min): 12 min Charges:  OT General Charges $OT Visit: 1 Procedure OT Evaluation $Initial OT Evaluation Tier I: 1 Procedure G-Codes:    Kamarie Veno 2014-09-03, 1:52 PM  Lesle Chris, OTR/L [phone] 09/03/2014

## 2014-08-18 NOTE — Consult Note (Signed)
[facility]iatry Consult   Reason for Consult:  Alcoholic pancreatitis and altered mental status Referring Physician:  Dr. Maryland Pink  Patient Identification: Brandon Oneill MRN:  [id number] Principal Diagnosis: Pancreatitis, acute Diagnosis:   Patient Active Problem List   Diagnosis Date Noted  . Acute pancreatitis [K85.9]   . Pancreatitis, acute [K85.9] 08/16/2014  . Acute encephalopathy [G93.40] 08/16/2014  . 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 Alcohol abuse [F10.10] 08/16/2014  . Normocytic anemia [D64.9] 08/16/2014    Total Time spent with patient: 45 minutes  Subjective:   Brandon Oneill is a 34 y.o. male patient admitted with AMS and acute alcoholic pancreatitis and possible pseudocyst.  HPI:  Brandon Oneill is a 34 y.o. male seen and chart reviewed and case discussed with Dr. Maryland Pink for psychiatric consultation and evaluation of altered mental status, acute alcoholic pancreatitis, pancreatic pseudocystand alcohol dependence. 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 Patient cannot drive since 5621 because of his previous DUIs 2 and denies current pending charges. Patient has history of multiple alcoholic pancreatitis and reportedly he started drinking alcohol to reduce his pain from pancreatitis. Patient drinks beer 40 ounces up to 6 a day. Patient drinks one week at a time like a binge drinking and no drinking for sometime. Patient denies alcohol withdrawal seizures. Patient reported his drinking pattern is based on his psychosocial situation, support and financial issues. Patient has previously received multiple alcohol detox treatments and also reportedly rehabilitation treatment including at [place], [place], [place].   Patient currently complains about inflamed intestine and pancreatitis has been resolving since admitted  initially at [facility]. Patient denies history of depression, anxiety, bipolar disorder and psychosis. Patient has denied current suicidal, homicidal ideation, intention or plans. Patient is willing to participate in substance abuse rehabilitation services when medically stable. Patient is single does not have a relationship or children.  Medical history: Pt with recent admission at [facility] 2ary to acute pancreatitis. Patient was transferred here to [facility] for further evaluation and recommendations for suspected pseudocyst. The patient reports that he has not drank alcohol for more than 1 week. Still having abdominal discomfort. Denies any fevers or chills. No nausea reported. Denies any emesis  HPI Elements:  Location:  Substance abuse and altered mental status. Quality:  Poor. Severity:  Unable to care for himself. Timing:  No job and relapse of drinking. Duration:  2 weeks. Context:  Multiple psychosocial stressors.  Past Medical History:  Past Medical History  Diagnosis Date  . Acute alcoholic pancreatitis 30/8657    CT 08/15/14 shows evolving psedocyts  . Alcoholism 08/2014  . Anemia 08/2014    macrocytic   History reviewed. No pertinent past surgical history. Family History: History reviewed. No pertinent family history. Social History:  History  Alcohol Use No     History  Drug Use No    History   Social History  . Marital Status: Single    Spouse Name: N/A  . Number of Children: N/A  . Years of Education: N/A   Social History Main Topics  . Smoking status: Former Smoker -- 1.00 packs/day for 20 years    Types: Cigarettes  . Smokeless tobacco: Never Used  . Alcohol Use: No  . Drug Use: No  . Sexual Activity: No   Other Topics Concern  . None   Social  History Narrative   Additional Social History:      Allergies:   Allergies  Allergen Reactions  . Erythromycin Rash    Vitals: Blood pressure 121/83, pulse 111,  temperature 97.4 F (36.3 C), temperature source Oral, resp. rate 20, height 6' (1.829 m), weight 90.719 kg (200 lb), SpO2 99 %.  Risk to Self: Is patient at risk for suicide?: No Risk to Others:   Prior Inpatient Therapy:   Prior Outpatient Therapy:    Current Facility-Administered Medications  Medication Dose Route Frequency Provider Last Rate Last Dose  . 0.9 %  sodium chloride infusion   Intravenous Continuous Velvet Bathe, MD 75 mL/hr at 08/17/14 1941    . folic acid (FOLVITE) tablet 1 mg  1 mg Oral Daily Rhetta Mura Schorr, NP   1 mg at 08/17/14 0941  . heparin injection 5,000 Units  5,000 Units Subcutaneous 3 times per day Velvet Bathe, MD   5,000 Units at 08/18/14 0640  . LORazepam (ATIVAN) tablet 1 mg  1 mg Oral Q6H PRN Jeryl Columbia, NP   1 mg at 08/17/14 1953  . morphine 2 MG/ML injection 2 mg  2 mg Intravenous Q4H PRN Velvet Bathe, MD   2 mg at 08/16/14 0014  . multivitamin with minerals tablet 1 tablet  1 tablet Oral Daily Rhetta Mura Schorr, NP   1 tablet at 08/17/14 0941  . ondansetron (ZOFRAN) tablet 4 mg  4 mg Oral Q6H PRN Velvet Bathe, MD       Or  . ondansetron (ZOFRAN) injection 4 mg  4 mg Intravenous Q6H PRN Velvet Bathe, MD      . oxyCODONE (Oxy IR/ROXICODONE) immediate release tablet 5-10 mg  5-10 mg Oral Q4H PRN Velvet Bathe, MD   10 mg at 08/17/14 0817  . [START ON 08/21/2014] thiamine (VITAMIN B-1) tablet 100 mg  100 mg Oral Daily Roland Rack, MD      . thiamine 500mg  in normal saline (58ml) IVPB  500 mg Intravenous TID Roland Rack, MD   500 mg at 08/17/14 2231    Musculoskeletal: Strength & Muscle Tone: decreased Gait & Station: unable to stand Patient leans: N/A  Psychiatric Specialty Exam: Physical Exam as per history and physical   ROS substance abuse, abdominal pain relapse of drinking alcohol   Blood pressure 121/83, pulse 111, temperature 97.4 F (36.3 C), temperature source Oral, resp. rate 20, height 6' (1.829 m), weight 90.719  kg (200 lb), SpO2 99 %.Body mass index is 27.12 kg/(m^2).  General Appearance: Guarded  Eye Contact::  Good  Speech:  Clear and Coherent and Slow  Volume:  Decreased  Mood:  Anxious and Depressed  Affect:  Depressed and Flat  Thought Process:  Coherent and Goal Directed  Orientation:  Other:  Oriented to his name, date of birth and abdominal pain  Thought Content:  Rumination  Suicidal Thoughts:  No  Homicidal Thoughts:  No  Memory:  Immediate;   Poor Recent;   Poor  Judgement:  Impaired  Insight:  Lacking  Psychomotor Activity:  Decreased  Concentration:  Fair  Recall:  [place] of Knowledge:Good  Language: Good  Akathisia:  NA  Handed:  Right  AIMS (if indicated):     Assets:  Communication Skills Desire for Improvement Housing Leisure Time Resilience  ADL's:  Impaired  Cognition: Impaired,  Mild  Sleep:      Medical Decision Making: Established Problem, Stable/Improving (1), Review of Psycho-Social Stressors (1), Review or order clinical lab  tests (1), Review or order medicine tests (1), Review of Medication Regimen & Side Effects (2) and Review of New Medication or Change in Dosage (2)  Treatment Plan Summary: Daily contact with patient to assess and evaluate symptoms and progress in treatment and Medication management  Plan:  Recommended no psychiatric medication management Patient does not need alcohol detox treatment at this time as he has no acute withdrawal symptoms and continue supportive treatment including thiamine supplementation as recommended by neurology Patient does not meet criteria for psychiatric inpatient admission. Patient will be referred to residential substance abuse rehabilitation services and medically stable   Disposition: Refer to the psychiatric social service regarding placement in a residential substance abuse rehabilitation services when medically stable.  Josceline Chenard,JANARDHAHA R. 08/18/2014 8:02 AM

## 2014-08-18 NOTE — Progress Notes (Signed)
[facility] PROGRESS NOTE  Brandon Oneill XBM:[id number] DOB: 05/25/1981 DOA: 08/15/2014  PCP: Unknown  Brief HPI: 34 year old Caucasian male who was sent over here from [facility] for further management of pancreatitis and pancreatic pseudocyst. He has a history of alcoholism. He was also noted to be encephalopathic, which prompted neurology and psychiatry consultations.  Consultants: Gen. Surgery, GI, Neurology, Psychiatry  Procedures:  EEG 2/10 "Clinical Interpretation: This normal EEG is recorded in the waking and drowsy state. There was no seizure or seizure predisposition recorded on this study."  Antibiotics: None  Subjective: Patient drowsy this morning but easily arousable. Remains confused and distracted.  Objective: Vital Signs  Filed Vitals:   08/17/14 0500 08/17/14 1422 08/17/14 2215 08/18/14 0652  BP: 125/73 119/65 130/76 121/83  Pulse: 104 109 94 111  Temp: 98.5 F (36.9 C) 98.2 F (36.8 C) 98.4 F (36.9 C) 97.4 F (36.3 C)  TempSrc: Oral Oral Oral Oral  Resp: 20 20 20 20   Height:      Weight:      SpO2: 98% 98% 97% 99%    Intake/Output Summary (Last 24 hours) at 08/18/14 0924 Last data filed at 08/18/14 0041  Gross per 24 hour  Intake   1440 ml  Output    700 ml  Net    740 ml   Filed Weights   08/15/14 1255  Weight: 90.719 kg (200 lb)     General appearance: Slow to respond. In no distress. Resp: clear to auscultation bilaterally Cardio: regular rate and rhythm, S1, S2 normal, no murmur, click, rub or gallop GI: Soft, less tender in the upper abdomen without any rebound, rigidity or guarding. No masses. Liver edge is palpable. Bowel sounds are present. Extremities: extremities normal, atraumatic, no cyanosis or edema Neurologic: Drowsy this morning. Easily arousable. Moving all his extremities. No jerking movements noted today.  Lab Results:  Basic Metabolic Panel:  Recent Labs Lab 08/15/14 1647  08/16/14 0440 08/17/14 0507 08/18/14 0530  NA 136 134* 134* 137  K 4.8 4.3 4.0 3.9  CL 101 99 102 106  CO2 23 25 25 24   GLUCOSE 90 94 101* 111*  BUN 12 11 8  5*  CREATININE 0.69 0.72 0.73 0.64  CALCIUM 8.9 8.4 8.5 8.3*  MG 1.9  --   --   --   PHOS 5.1*  --   --   --    Liver Function Tests:  Recent Labs Lab 08/15/14 1647 08/17/14 0507  AST 40* 25  ALT 40 33  ALKPHOS 72 60  BILITOT 0.7 0.5  PROT 8.1 7.2  ALBUMIN 2.8* 2.4*    Recent Labs Lab 08/15/14 1647 08/16/14 0440 08/17/14 0507 08/18/14 0530  LIPASE 89* 99* 96* 105*    Recent Labs Lab 08/16/14 0916  AMMONIA 19   CBC:  Recent Labs Lab 08/15/14 1647 08/16/14 0440 08/17/14 0507 08/18/14 0530  WBC 21.0* 16.8* 13.7* 11.4*  HGB 9.7* 9.0* 7.9* 8.2*  HCT 29.8* 27.0* 24.2* 24.7*  MCV 100.3* 98.5 98.4 98.0  PLT 628* 844* 887* 829*      Studies/Results: Ct Head Wo Contrast  08/16/2014   CLINICAL DATA:  Initial evaluation involuntary body spasms, incoherent, confused, garbled speech  EXAM: CT HEAD WITHOUT CONTRAST  TECHNIQUE: Contiguous axial images were obtained from the base of the skull through the vertex without intravenous contrast.  COMPARISON:  07/29/12  FINDINGS: Mild diffuse atrophy. No abnormal attenuation to suggest mass infarct hemorrhage or extra-axial fluid. No hydrocephalus.  Calvarium intact. No inflammatory changes in the visualized sinuses or mastoid air cells. Study mildly limited by motion artifact.  IMPRESSION: No acute abnormalities. Mild atrophy somewhat disproportionate for age.   Electronically Signed   By: Skipper Cliche M.D.   On: 08/16/2014 13:37   Mr Brain Wo Contrast  08/17/2014   CLINICAL DATA:  Myoclonus and altered mental status concurrent with acute pancreatitis. Blurry vision, worse on the RIGHT.  EXAM: MRI HEAD WITHOUT CONTRAST  TECHNIQUE: Multiplanar, multiecho pulse sequences of the brain and surrounding structures were obtained without intravenous contrast.  COMPARISON:  CT  of the head August 16, 2014  FINDINGS: Mild motion degraded examination, however the coronal T2 is moderately motion degraded. The ventricles and sulci are upper limits of normal for patient's age. No abnormal parenchymal signal, mass lesions, mass effect. No reduced diffusion to suggest acute ischemia. No susceptibility artifact to suggest hemorrhage.  No abnormal extra-axial fluid collections. No extra-axial masses though, contrast enhanced sequences would be more sensitive. Normal major intracranial vascular flow voids seen at the skull base.  Ocular globes and orbital contents are unremarkable though not tailored for evaluation. No abnormal sellar expansion. Trace paranasal sinus mucosal thickening without air-fluid levels. The mastoid air cells appear well-aerated. No suspicious calvarial bone marrow signal. No abnormal sellar expansion. Craniocervical junction maintained.  IMPRESSION: Motion degraded examination without acute intracranial process.  Mild nonspecific parenchymal brain volume loss for age can be seen with dehydration or acute illness.   Electronically Signed   By: Elon Alas   On: 08/17/2014 22:13   Dg Chest Port 1 View  08/16/2014   CLINICAL DATA:  PICC line placement  EXAM: PORTABLE CHEST - 1 VIEW  COMPARISON:  08/08/2014  FINDINGS: Right arm PICC terminates at the cavoatrial junction.  Lungs are essentially clear. Prominent interstitial marking. No pleural effusion or pneumothorax.  The heart is normal in size.  IMPRESSION: Right arm PICC terminates at the cavoatrial junction.   Electronically Signed   By: Julian Hy M.D.   On: 08/16/2014 10:53    Medications:  Scheduled: . folic acid  1 mg Oral Daily  . heparin  5,000 Units Subcutaneous 3 times per day  . multivitamin with minerals  1 tablet Oral Daily  . [START ON 08/21/2014] thiamine  100 mg Oral Daily  . thiamine IV  500 mg Intravenous TID   Continuous: . sodium chloride 75 mL/hr at 08/17/14 1941    [id number] **OR** [DISCONTINUED] LORazepam, morphine injection, ondansetron **OR** ondansetron (ZOFRAN) IV, oxyCODONE  Assessment/Plan:  Principal Problem:   Pancreatitis, acute Active Problems:   Acute encephalopathy   Pancreatic pseudocyst   Alcohol abuse   Normocytic anemia   Acute pancreatitis    Acute Alcoholic Pancreatitis with developing pseudocysts Lipase level is improved. It was in the 100s at [facility]. Ammonia level is normal. Continue full liquid diet. Surgery has been following. No surgical intervention planned. Appreciate gastroenterology input. Pseudocyst will need to be followed closely over a period of weeks to months. No indication for urgent intervention.  Acute encephalopathy with myoclonic jerks Etiology remains unclear. Could be related to his alcohol use/wernicke. CT Head was unremarkable. Appreciate neurology input. MRI brain does not show any acute findings. He is on high dose of thiamine currently. EEG did not show any epileptic activity. Cut back on sedation. HIV is nonreactive. RPR is nonreactive. TSH is normal. We oriented daily.  H/o alcohol abuse His last alcoholic drink was more than 1 month ago. Ativan  as needed. He had admitted to some suicidal ideation over at [facility]. He was seen by psychiatry there and this was not thought to be a significant issue. He has not mentioned these ideations here. Psychiatry input is appreciated.   Normocytic anemia Anemia panel reviewed. No iron deficiency. Hemoglobin is stable today compared to yesterday. Lower hemoglobin than at admission is probably due to dilution. No overt bleeding. Vitamin N-56 and folic acid were also not deficient.  Thrombocytosis Possibly related to acute inflammation. Continue to monitor  DVT Prophylaxis: Heparin    Code Status: Full code  Family Communication: Discussed with the patient. Discussed with his mother over the phone. Disposition Plan: Not ready for  discharge. Mobilize.     LOS: 3 days   [facility] Pager [phone] 08/18/2014, 9:24 AM  If 7PM-7AM, please contact night-coverage at [email or web address], password [facility]

## 2014-08-18 NOTE — Progress Notes (Signed)
[facility] Progress Note  Subjective:   More alert today, has less abd discomfort   Objective:  Vital signs in last 24 hours: Temp:  [97.4 F (36.3 C)-98.4 F (36.9 C)] 97.4 F (36.3 C) (02/12 0652) Pulse Rate:  [94-111] 111 (02/12 0652) Resp:  [20] 20 (02/12 0652) BP: (119-130)/(65-83) 121/83 mmHg (02/12 0652) SpO2:  [97 %-99 %] 99 % (02/12 0652) Last BM Date: 08/17/14 General:   Alert,  Well-developed,   in NAD Heart:  Regular rate and rhythm; no murmurs Pulm;lungs clear Abdomen:  Soft, mild upper abd tenderness--less than yesterday. Normal bowel sounds, without guarding, and without rebound.   Extremities:  Without edema. Neurologic:  Alert and  oriented x4 Psych:  Alert and cooperative. Normal mood and affect.  Intake/Output from previous day: 02/11 0701 - 02/12 0700 In: 1440 [P.O.:1440] Out: 700 [Urine:700] Intake/Output this shift:    Lab Results:  Recent Labs  08/16/14 0440 08/17/14 0507 08/18/14 0530  WBC 16.8* 13.7* 11.4*  HGB 9.0* 7.9* 8.2*  HCT 27.0* 24.2* 24.7*  PLT 844* 887* 829*   BMET  Recent Labs  08/16/14 0440 08/17/14 0507 08/18/14 0530  NA 134* 134* 137  K 4.3 4.0 3.9  CL 99 102 106  CO2 25 25 24   GLUCOSE 94 101* 111*  BUN 11 8 5*  CREATININE 0.72 0.73 0.64  CALCIUM 8.4 8.5 8.3*   LFT  Recent Labs  08/17/14 0507  PROT 7.2  ALBUMIN 2.4*  AST 25  ALT 33  ALKPHOS 60  BILITOT 0.5   PT/INR No results for input(s): LABPROT, INR in the last 72 hours. Hepatitis Panel No results for input(s): HEPBSAG, HCVAB, HEPAIGM, HEPBIGM in the last 72 hours.  Mr Brain Wo Contrast  08/17/2014   CLINICAL DATA:  Myoclonus and altered mental status concurrent with acute pancreatitis. Blurry vision, worse on the RIGHT.  EXAM: MRI HEAD WITHOUT CONTRAST  TECHNIQUE: Multiplanar, multiecho pulse sequences of the brain and surrounding structures were obtained without intravenous contrast.  COMPARISON:  CT of the head August 16, 2014  FINDINGS: Mild motion degraded examination, however the coronal T2 is moderately motion degraded. The ventricles and sulci are upper limits of normal for patient's age. No abnormal parenchymal signal, mass lesions, mass effect. No reduced diffusion to suggest acute ischemia. No susceptibility artifact to suggest hemorrhage.  No abnormal extra-axial fluid collections. No extra-axial masses though, contrast enhanced sequences would be more sensitive. Normal major intracranial vascular flow voids seen at the skull base.  Ocular globes and orbital contents are unremarkable though not tailored for evaluation. No abnormal sellar expansion. Trace paranasal sinus mucosal thickening without air-fluid levels. The mastoid air cells appear well-aerated. No suspicious calvarial bone marrow signal. No abnormal sellar expansion. Craniocervical junction maintained.  IMPRESSION: Motion degraded examination without acute intracranial process.  Mild nonspecific parenchymal brain volume loss for age can be seen with dehydration or acute illness.   Electronically Signed   By: Elon Alas   On: 08/17/2014 22:13    ASSESSMENT/PLAN:  34 yo male admitted from [place] with acute alcoholic pancreatitis with developing pseudocysts. No surgical intervention planned. Pt will need to be followed post d/c--no indication for intervention at this time. As pt lives in [place], and he says he does not have a GI MD there, suggest provide pt with number of Dr Lucilla Lame, GI in [place]--[phone].      LOS: 3 days   Simmone Cape, Vita Barley PA-C 08/18/2014, Pager [phone]

## 2014-08-18 NOTE — Progress Notes (Signed)
Clinical Social Work Department CLINICAL SOCIAL WORK PSYCHIATRY SERVICE LINE ASSESSMENT 08/18/2014  Patient:  Brandon Oneill Fread  Account:  [id number]  [place] Date:  08/15/2014  Clinical Social Worker:  Sindy Messing, LCSW  Date/Time:  08/18/2014 04:00 PM Referred by:  Physician  Date referred:  08/18/2014 Reason for Referral  Psychosocial assessment   Presenting Symptoms/Problems (In the person's/family's own words):   Psych consulted due to substance use.   Abuse/Neglect/Trauma History (check all that apply)  Denies history   Abuse/Neglect/Trauma Comments:   Psychiatric History (check all that apply)  Residential treatment   Psychiatric medications:  Ativan 1 mg   Current Mental Health Hospitalizations/Previous Mental Health History:   Patient reports he feels depressed at times but does not have any follow up in the community. Patient believes that depression is related to alcohol use.   Current provider:   None currently   Place and Date:   N/A   Current Medications:   Scheduled Meds:      . folic acid  1 mg Oral Daily  . heparin  5,000 Units Subcutaneous 3 times per day  . multivitamin with minerals  1 tablet Oral Daily  . [START ON 08/21/2014] thiamine  100 mg Oral Daily  . thiamine IV  500 mg Intravenous TID        Continuous Infusions:      . sodium chloride 50 mL/hr at 08/18/14 1306          PRN Meds:.LORazepam **OR** [DISCONTINUED] LORazepam, morphine injection, ondansetron **OR** ondansetron (ZOFRAN) IV, oxyCODONE       Previous Impatient Admission/Date/Reason:   Patient reports he has been to rehab facilities in the past but never stayed for the entire stay.   Emotional Health / Current Symptoms    Suicide/Self Harm  None reported   Suicide attempt in the past:   Patient denies any SI or HI.   Other harmful behavior:   None reported   Psychotic/Dissociative Symptoms  None reported   Other Psychotic/Dissociative Symptoms:   Patient denies any AH  or VH.    Attention/Behavioral Symptoms  Inattentive   Other Attention / Behavioral Symptoms:   Patient manic with pressured speech and flight of ideas.    Cognitive Impairment  Within Normal Limits   Other Cognitive Impairment:   Patient alert and oriented.    Mood and Adjustment  Euphoric    Stress, Anxiety, Trauma, Any Recent Loss/Stressor  Current Legal Problems/Pending Court Date   Anxiety (frequency):   N/A   Phobia (specify):   N/A   Compulsive behavior (specify):   N/A   Obsessive behavior (specify):   N/A   Other:   Patient reports he lost his license due to too many DUIs which has now made it difficult for him to find a job and he has lost hope.   Substance Abuse/Use  Current substance use   SBIRT completed (please refer for detailed history):  Y  Self-reported substance use:   Patient reports he drinks 5 40 oz beers daily. Patient reports he uses coke and marijuana about once a year. Patient completed SBIRT and scored a total score of 27. Patient is interested in treatment but is unsure if he wants to complete residential treatment.   Urinary Drug Screen Completed:  Y Alcohol level:   Opiates    Environmental/Housing/Living Arrangement  Stable housing   Who is in the home:   Alone   Emergency contact:  [place]  Patient's Strengths and Goals (patient's own words):   Patient reports mother is supportive.   Clinical Social Worker's Interpretive Summary:   CSW received referral in order to complete psychosocial assessment. CSW reviewed chart and spoke with psych MD who reports that he recommends residential SA treatment at [place].    CSW met with patient at bedside. Patient lives alone but reports his mother is involved and reports he could stay with her if needed. Patient reports that he used to work but lost his license after getting DUIs and has struggled with finding employment since he does not have reliable  transportation. Patient reports that he drinks about 5 40oz beers on a daily basis.    CSW and patient discussed patient's emotional attachment to drinking alcohol and patient reports he has been depressed since he does not have a job and tends to isolate and drink during the days. Patient reports that he needs to find his hope again in order to stay sober. CSW explained treatment options and inquired about previous treatment. Patient has only attended [place] meetings and therapy when it has been court mandated but did not feel it was effective. Patient has been to residential treatment in the past but reports that he never stayed to complete program.    Patient is not currently insured but reports he wants to go to a rehab facility that offers assistance with finding a job as well. Patient reports he spoke with someone in [place] but cannot provide any further information. CSW researched [employer] options and spoke with [place] who reports patient does not live in catchment area so he would have to go on waiting list and they are unsure how long waiting list at this time. CSW spoke with patient re: other residential programs but patient is not ready to commit to residential treatment. Patient reports he is more worried about a job than treatment. Patient and CSW discussed the importance of staying sober in order to maintain a job but patient reports treatment will not give him hope. CSW explained other treatment facilities such as [facility], [facility] or [facility]. Patient reports he wants to talk with family and think about options before making any commitments. CSW agreeable to follow up at later time in order to assist with resources.    Patient has pressured speech and manic features during assessment. Patient is easily distracted and has to be redirected often. Patient continues to report he needs hope to stay sober but is unable to identify any specific goals of how he would like to remain sober and is not  fully agreeable to treatment.    CSW will continue to follow.   Disposition:  Recommend Psych CSW continuing to support while in hospital   [facility], [place] [phone]

## 2014-08-19 LAB — CBC
HEMATOCRIT: 24.2 % — AB (ref 39.0–52.0)
HEMOGLOBIN: 8.1 g/dL — AB (ref 13.0–17.0)
MCH: 32.8 pg (ref 26.0–34.0)
MCHC: 33.5 g/dL (ref 30.0–36.0)
MCV: 98 fL (ref 78.0–100.0)
Platelets: 770 10*3/uL — ABNORMAL HIGH (ref 150–400)
RBC: 2.47 MIL/uL — AB (ref 4.22–5.81)
RDW: 12.4 % (ref 11.5–15.5)
WBC: 10.9 10*3/uL — ABNORMAL HIGH (ref 4.0–10.5)

## 2014-08-19 LAB — BASIC METABOLIC PANEL
Anion gap: 8 (ref 5–15)
CHLORIDE: 108 mmol/L (ref 96–112)
CO2: 24 mmol/L (ref 19–32)
Calcium: 8.1 mg/dL — ABNORMAL LOW (ref 8.4–10.5)
Creatinine, Ser: 0.73 mg/dL (ref 0.50–1.35)
GFR calc Af Amer: >90 mL/min (ref >90)
GFR calc non Af Amer: >90 mL/min (ref >90)
Glucose, Bld: 100 mg/dL — ABNORMAL HIGH (ref 70–99)
Potassium: 3.8 mmol/L (ref 3.5–5.1)
SODIUM: 140 mmol/L (ref 135–145)

## 2014-08-19 NOTE — Progress Notes (Signed)
Clinical Social Work  CSW met with patient, mom, and sister-in-law at bedside. Patient agreeable to family being involved during assessment. Patient reports he has thought about options and his first priority is finding a job. CSW explained the importance of being sober prior to getting a job to ensure he could maintain employment. Patient's family agrees and encouraged patient to follow up on treatment.  CSW provided substance abuse treatment and explained [facility] has a waiting list but provided other resources such as [facility], [facility] or [facility] for treatment. Patient reports he thinks he would be most interested in staying in an [place] and working on finding a job. CSW explained patient would need to remain sober to stay in an [facility] and provided information for outpatient and intensive outpatient programs for support. Patient plans to return home with mom until he can make final arrangements for placement at an [facility] or if he chooses to go to residential treatment.  CSW will continue to follow.  Sindy Messing, LCSW (Weekend Coverage)

## 2014-08-19 NOTE — Consult Note (Signed)
Psychiatry Consult follow up   Reason for Consult:  Alcoholic pancreatitis and altered mental status Referring Physician:  Dr. Maryland Pink  Patient Identification: Brandon Oneill MRN:  [id number] Principal Diagnosis: Pancreatitis, acute Diagnosis:   Patient Active Problem List   Diagnosis Date Noted  . Acute pancreatitis [K85.9]   . Pancreatitis, acute [K85.9] 08/16/2014  . Acute encephalopathy [G93.40] 08/16/2014  . 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 Alcohol abuse [F10.10] 08/16/2014  . Normocytic anemia [D64.9] 08/16/2014    Total Time spent with patient: 45 minutes  Subjective:   [name] is a 34 y.o. male admitted with AMS and acute alcoholic pancreatitis and possible pseudocyst.  HPI:  [name] is a 34 y.o. male seen and chart reviewed and case discussed with Dr. Maryland Pink for psychiatric consultation and evaluation of altered mental status, acute alcoholic pancreatitis, pancreatic pseudocystand alcohol dependence. 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 Patient cannot drive since 2426 because of his previous DUIs 2 and denies current pending charges. Patient has history of multiple alcoholic pancreatitis and reportedly he started drinking alcohol to reduce his pain from pancreatitis. Patient drinks beer 40 ounces up to 6 a day. Patient drinks one week at a time like a binge drinking and no drinking for sometime. Patient denies alcohol withdrawal seizures. Patient reported his drinking pattern is based on his psychosocial situation, support and financial issues. Patient has previously received multiple alcohol detox treatments and also reportedly rehabilitation treatment including at [place], [place], [place]. Patient currently complains about inflamed intestine and pancreatitis has been resolving since admitted initially at  [facility]. Patient denies history of depression, anxiety, bipolar disorder and psychosis. Patient has denied current suicidal, homicidal ideation, intention or plans. Patient is willing to participate in substance abuse rehabilitation services when medically stable. Patient is single does not have a relationship or children.  Interim history: patient seen and met with patient, mom, and sister-in-law who are at bedside. Patient stated that he is feeling better, more awake, alert and oriented well. He has understanding about his alcohol dependence and alcohol pancreatis and need for staying sober. Patient met with LCSW and has thought about options and wishes to go to [facility].  Patient's family agrees and encouraged patient to follow up on treatment. Patient mother prefers [facility], which has a waiting list but patient says he does know about rehabilitation treatment, went through several times and prefers [facility] in [place] and working on finding a job. Patient would need to remain sober to stay in an [facility].   Past Medical History:  Past Medical History  Diagnosis Date  . Acute alcoholic pancreatitis 83/4196    CT 08/15/14 shows evolving pseudocyts.    . Alcoholism 08/2014    Chronic. Detox admits x 3, one to [place].   . Anemia 08/2014    macrocytic  . Alcohol withdrawal seizure 2013  . Delirium tremens 07/2014    Past Surgical History  Procedure Laterality Date  . Incision and drainage abscess  2008    located on low back   Family History:  Family History  Problem Relation Age of Onset  . Alcoholism Father    Social History:  History  Alcohol Use No    Comment: 04/2014: [facility] RX'd Naltrexone, not sure if he ever used this.      History  Drug Use No    History   Social History  . Marital Status: Single    Spouse Name: N/A  . Number of Children: N/A  . Years of Education: N/A   Occupational History  .      previously worked as a  Astronomer in [employer]    Social History Main Topics  . Smoking status: Former Smoker -- 1.00 packs/day for 20 years    Types: Cigarettes  . Smokeless tobacco: Never Used  . Alcohol Use: No     Comment: 04/2014: [facility] RX'd Naltrexone, not sure if he ever used this.   . Drug Use: No  . Sexual Activity: No   Other Topics Concern  . None   Social History Narrative   Lost licence due to DUI in 2004.     Has worked as a Astronomer in [employer]    Additional Social History:      Allergies:   Allergies  Allergen Reactions  . Erythromycin Rash    Vitals: Blood pressure 110/70, pulse 104, temperature 98.1 F (36.7 C), temperature source Oral, resp. rate 20, height 6' (1.829 m), weight 90.719 kg (200 lb), SpO2 98 %.  Risk to Self: Is patient at risk for suicide?: No Risk to Others:   Prior Inpatient Therapy:   Prior Outpatient Therapy:    Current Facility-Administered Medications  Medication Dose Route Frequency Provider Last Rate Last Dose  . 0.9 %  sodium chloride infusion   Intravenous Continuous Bonnielee Haff, MD 50 mL/hr at 08/18/14 1306    . folic acid (FOLVITE) tablet 1 mg  1 mg Oral Daily Rhetta Mura Schorr, NP   1 mg at 08/19/14 1055  . heparin injection 5,000 Units  5,000 Units Subcutaneous 3 times per day Velvet Bathe, MD   5,000 Units at 08/19/14 0602  . morphine 2 MG/ML injection 2 mg  2 mg Intravenous Q4H PRN Velvet Bathe, MD   2 mg at 08/16/14 0014  . multivitamin with minerals tablet 1 tablet  1 tablet Oral Daily Rhetta Mura Schorr, NP   1 tablet at 08/19/14 1055  . ondansetron (ZOFRAN) tablet 4 mg  4 mg Oral Q6H PRN Velvet Bathe, MD       Or  . ondansetron (ZOFRAN) injection 4 mg  4 mg Intravenous Q6H PRN Velvet Bathe, MD      . oxyCODONE (Oxy IR/ROXICODONE) immediate release tablet 5-10 mg  5-10 mg Oral Q4H PRN Velvet Bathe, MD   10 mg at 08/19/14 1055  . [START ON 08/21/2014] thiamine (VITAMIN B-1) tablet 100 mg  100 mg Oral Daily Roland Rack, MD       . thiamine 51m in normal saline (513m IVPB  500 mg Intravenous TID McRoland RackMD   500 mg at 08/19/14 1055    Musculoskeletal: Strength & Muscle Tone: decreased Gait & Station: unable to stand Patient leans: N/A  Psychiatric Specialty Exam: Physical Exam as per history and physical   ROS substance abuse, abdominal pain relapse of drinking alcohol   Blood pressure 110/70, pulse 104, temperature 98.1 F (36.7 C), temperature source Oral, resp. rate 20, height 6' (1.829 m), weight 90.719 kg (200 lb), SpO2 98 %.Body mass index is 27.12 kg/(m^2).  General Appearance: Guarded  Eye Contact::  Good  Speech:  Clear and Coherent and Slow  Volume:  Decreased  Mood:  Anxious  Affect:  Appropriate and Congruent  Thought Process:  Coherent and Goal Directed  Orientation:  Other:  Oriented to his name,  date of birth and abdominal pain  Thought Content:  Rumination  Suicidal Thoughts:  No  Homicidal Thoughts:  No  Memory:  Immediate;   Poor Recent;   Poor  Judgement:  Impaired  Insight:  Lacking  Psychomotor Activity:  Decreased  Concentration:  Fair  Recall:  Leon of Knowledge:Good  Language: Good  Akathisia:  NA  Handed:  Right  AIMS (if indicated):     Assets:  Communication Skills Desire for Improvement Housing Leisure Time Resilience  ADL's:  Impaired  Cognition: Impaired,  Mild  Sleep:      Medical Decision Making: Established Problem, Stable/Improving (1), Review of Psycho-Social Stressors (1), Review or order clinical lab tests (1), Review or order medicine tests (1), Review of Medication Regimen & Side Effects (2) and Review of New Medication or Change in Dosage (2)  Treatment Plan Summary: Daily contact with patient to assess and evaluate symptoms and progress in treatment and Medication management  Plan:  Patient does not need alcohol detox treatment at this time as he has no acute withdrawal symptoms and continue supportive treatment including  thiamine supplementation as recommended by neurology Patient does not meet criteria for psychiatric inpatient admission. Patient will be referred to residential substance abuse rehabilitation services and medically stable  Patient prefer to stay in [place] and finding a job near by.    Disposition: Refer to the psychiatric social service regarding placement in a residential substance abuse rehabilitation services when medically stable.  Shaela Boer,JANARDHAHA R. 08/19/2014 12:54 PM

## 2014-08-19 NOTE — Progress Notes (Signed)
[facility] PROGRESS NOTE  Brandon Oneill NOM:[id number] DOB: Aug 13, 1980 DOA: 08/15/2014  PCP: Unknown  Brief HPI: 34 year old Caucasian male who was sent over here from [facility] for further management of pancreatitis and pancreatic pseudocyst. He has a history of alcoholism. He was also noted to be encephalopathic, which prompted neurology and psychiatry consultations.  Consultants: Gen. Surgery, GI, Neurology, Psychiatry  Procedures:  EEG 2/10 "Clinical Interpretation: This normal EEG is recorded in the waking and drowsy state. There was no seizure or seizure predisposition recorded on this study."  Antibiotics: None  Subjective: Patient more lucid today. Continues to have some abdominal pain but denies any nausea, vomiting. He is tolerating his full liquid diet.   Objective: Vital Signs  Filed Vitals:   08/18/14 0652 08/18/14 1400 08/18/14 2212 08/19/14 0635  BP: 121/83 124/70 118/65 110/70  Pulse: 111 109 104   Temp: 97.4 F (36.3 C) 98.3 F (36.8 C) 99.3 F (37.4 C) 98.1 F (36.7 C)  TempSrc: Oral Oral Oral Oral  Resp: 20 20 20 20   Height:      Weight:      SpO2: 99% 98% 98% 98%    Intake/Output Summary (Last 24 hours) at 08/19/14 0845 Last data filed at 08/18/14 1908  Gross per 24 hour  Intake    480 ml  Output      0 ml  Net    480 ml   Filed Weights   08/15/14 1255  Weight: 90.719 kg (200 lb)     General appearance: Slow to respond. In no distress. Resp: clear to auscultation bilaterally Cardio: regular rate and rhythm, S1, S2 normal, no murmur, click, rub or gallop GI: Soft, less tender in the upper abdomen without any rebound, rigidity or guarding. No masses. Liver edge is palpable. Bowel sounds are present. Extremities: extremities normal, atraumatic, no cyanosis or edema Neurologic: Awake and alert. He knew year, month. Oriented to person as well. Somewhat confused about where he was. Moving all his extremities. No  jerking movements noted today.  Lab Results:  Basic Metabolic Panel:  Recent Labs Lab 08/15/14 1647 08/16/14 0440 08/17/14 0507 08/18/14 0530 08/19/14 0550  NA 136 134* 134* 137 140  K 4.8 4.3 4.0 3.9 3.8  CL 101 99 102 106 108  CO2 23 25 25 24 24   GLUCOSE 90 94 101* 111* 100*  BUN 12 11 8  5* <5*  CREATININE 0.69 0.72 0.73 0.64 0.73  CALCIUM 8.9 8.4 8.5 8.3* 8.1*  MG 1.9  --   --   --   --   PHOS 5.1*  --   --   --   --    Liver Function Tests:  Recent Labs Lab 08/15/14 1647 08/17/14 0507  AST 40* 25  ALT 40 33  ALKPHOS 72 60  BILITOT 0.7 0.5  PROT 8.1 7.2  ALBUMIN 2.8* 2.4*    Recent Labs Lab 08/15/14 1647 08/16/14 0440 08/17/14 0507 08/18/14 0530  LIPASE 89* 99* 96* 105*    Recent Labs Lab 08/16/14 0916  AMMONIA 19   CBC:  Recent Labs Lab 08/15/14 1647 08/16/14 0440 08/17/14 0507 08/18/14 0530 08/19/14 0550  WBC 21.0* 16.8* 13.7* 11.4* 10.9*  HGB 9.7* 9.0* 7.9* 8.2* 8.1*  HCT 29.8* 27.0* 24.2* 24.7* 24.2*  MCV 100.3* 98.5 98.4 98.0 98.0  PLT 628* 844* 887* 829* 770*      Studies/Results: Mr Brain Wo Contrast  08/17/2014   CLINICAL DATA:  Myoclonus and altered mental status  concurrent with acute pancreatitis. Blurry vision, worse on the RIGHT.  EXAM: MRI HEAD WITHOUT CONTRAST  TECHNIQUE: Multiplanar, multiecho pulse sequences of the brain and surrounding structures were obtained without intravenous contrast.  COMPARISON:  CT of the head August 16, 2014  FINDINGS: Mild motion degraded examination, however the coronal T2 is moderately motion degraded. The ventricles and sulci are upper limits of normal for patient's age. No abnormal parenchymal signal, mass lesions, mass effect. No reduced diffusion to suggest acute ischemia. No susceptibility artifact to suggest hemorrhage.  No abnormal extra-axial fluid collections. No extra-axial masses though, contrast enhanced sequences would be more sensitive. Normal major intracranial vascular flow voids  seen at the skull base.  Ocular globes and orbital contents are unremarkable though not tailored for evaluation. No abnormal sellar expansion. Trace paranasal sinus mucosal thickening without air-fluid levels. The mastoid air cells appear well-aerated. No suspicious calvarial bone marrow signal. No abnormal sellar expansion. Craniocervical junction maintained.  IMPRESSION: Motion degraded examination without acute intracranial process.  Mild nonspecific parenchymal brain volume loss for age can be seen with dehydration or acute illness.   Electronically Signed   By: Elon Alas   On: 08/17/2014 22:13    Medications:  Scheduled: . folic acid  1 mg Oral Daily  . heparin  5,000 Units Subcutaneous 3 times per day  . multivitamin with minerals  1 tablet Oral Daily  . [START ON 08/21/2014] thiamine  100 mg Oral Daily  . thiamine IV  500 mg Intravenous TID   Continuous: . sodium chloride 50 mL/hr at 08/18/14 1306   ZYS:[id number] injection, ondansetron **OR** ondansetron (ZOFRAN) IV, oxyCODONE  Assessment/Plan:  Principal Problem:   Pancreatitis, acute Active Problems:   Acute encephalopathy   Pancreatic pseudocyst   Alcohol abuse   Normocytic anemia   Acute pancreatitis    Acute Alcoholic Pancreatitis with developing pseudocysts Lipase level is improved. It was in the 100s at [facility]. Ammonia level is normal. Continue full liquid diet. Surgery has been following. No surgical intervention planned. Appreciate gastroenterology input. Pseudocyst will need to be followed closely over a period of weeks to months. No indication for urgent intervention. Advance diet.  Acute encephalopathy with myoclonic jerks Etiology remains unclear. Could be related to his alcohol use/wernicke/encephalopathy due to pancreatitis. CT Head was unremarkable. Appreciate neurology input. MRI brain does not show any acute findings. He is on high dose of thiamine currently. EEG did not show any epileptic  activity. HIV is nonreactive. RPR is nonreactive. TSH is normal. Reorient daily.  H/o alcohol abuse His last alcoholic drink was more than 1 month ago. Ativan as needed. He had admitted to some suicidal ideation over at [facility] He was seen by psychiatry there and this was not thought to be a significant issue. He has not mentioned these ideations here. Psychiatry input is appreciated. Social worker is following for alcohol rehabilitation.  Normocytic anemia Anemia panel reviewed. No iron deficiency. Hemoglobin is stable. Lower hemoglobin than at admission is probably due to dilution. No overt bleeding. Vitamin U-93 and folic acid were also not deficient.  Thrombocytosis Possibly related to acute inflammation. This is improving. Continue to monitor  DVT Prophylaxis: Heparin    Code Status: Full code  Family Communication: Discussed with the patient. Discussed with his mother over the phone. Disposition Plan: Continue to mobilize. Disposition to be determined. He could end up going to alcohol rehabilitation if his mental status improves and if there is a place available. If not, he will  go home with his mother. Anticipate he'll be medically ready by Monday.    LOS: 4 days   [facility] Pager [phone] 08/19/2014, 8:45 AM  If 7PM-7AM, please contact night-coverage at [email or web address], password [email or web address]

## 2014-08-20 LAB — BASIC METABOLIC PANEL
Anion gap: 5 (ref 5–15)
BUN: <5 mg/dL — ABNORMAL LOW (ref 6–23)
CALCIUM: 8.4 mg/dL (ref 8.4–10.5)
CO2: 25 mmol/L (ref 19–32)
CREATININE: 0.75 mg/dL (ref 0.50–1.35)
Chloride: 107 mmol/L (ref 96–112)
GFR calc Af Amer: >90 mL/min (ref >90)
GLUCOSE: 102 mg/dL — AB (ref 70–99)
Potassium: 3.9 mmol/L (ref 3.5–5.1)
Sodium: 137 mmol/L (ref 135–145)

## 2014-08-20 LAB — CBC
HEMATOCRIT: 24.8 % — AB (ref 39.0–52.0)
HEMOGLOBIN: 8.3 g/dL — AB (ref 13.0–17.0)
MCH: 32.4 pg (ref 26.0–34.0)
MCHC: 33.5 g/dL (ref 30.0–36.0)
MCV: 96.9 fL (ref 78.0–100.0)
Platelets: 712 10*3/uL — ABNORMAL HIGH (ref 150–400)
RBC: 2.56 MIL/uL — ABNORMAL LOW (ref 4.22–5.81)
RDW: 12.3 % (ref 11.5–15.5)
WBC: 10.3 10*3/uL (ref 4.0–10.5)

## 2014-08-20 MED ORDER — THIAMINE HCL 100 MG PO TABS
100.0000 mg | ORAL_TABLET | Freq: Every day | ORAL | Status: DC
Start: 2014-08-21 — End: 2015-03-21

## 2014-08-20 MED ORDER — ADULT MULTIVITAMIN W/MINERALS CH: 1.0000 | ORAL_TABLET | Freq: Every day | ORAL | Status: DC

## 2014-08-20 MED ORDER — FOLIC ACID 1 MG PO TABS: 1.0000 mg | ORAL_TABLET | Freq: Every day | ORAL | Status: DC

## 2014-08-20 MED ORDER — OXYCODONE HCL 5 MG PO TABS: 5.0000 mg | ORAL_TABLET | ORAL | Status: DC | PRN

## 2014-08-20 MED ORDER — ONDANSETRON 4 MG PO TBDP: 4.0000 mg | ORAL_TABLET | Freq: Three times a day (TID) | ORAL | Status: DC | PRN

## 2014-08-20 NOTE — Progress Notes (Signed)
Patient discharged to home with family, discharge instructions reviewed with patient and family who verbalized understanding. New RX's given to patient. 

## 2014-08-20 NOTE — Discharge Summary (Signed)
[facility]  Physician Discharge Summary   Patient ID: Brandon Oneill MRN: [id number] DOB/AGE: 1981/06/11 34 y.o.  Admit date: 08/15/2014 Discharge date: 08/20/2014  DISCHARGE DIAGNOSES:  Principal Problem:   Pancreatitis, acute Active Problems:   Acute encephalopathy   Pancreatic pseudocyst   Alcohol abuse   Normocytic anemia   Acute pancreatitis   RECOMMENDATIONS FOR OUTPATIENT FOLLOW UP: 1. Closed follow-up with gastroenterology at [facility] 2. Patient to pursue alcohol rehabilitation  DISCHARGE CONDITION: fair  Diet recommendation: Low-fat bland diet  Filed Weights   08/15/14 1255  Weight: 90.719 kg (200 lb)    INITIAL HISTORY: 34 year old Caucasian male who was sent over here from [facility] for further management of pancreatitis and pancreatic pseudocyst. He has a history of alcoholism. He was also noted to be encephalopathic, which prompted neurology and psychiatry consultations.  Consultants: Gen. Surgery, GI, Neurology, Psychiatry  Procedures:  EEG 2/10 "Clinical Interpretation: This normal EEG is recorded in the waking and drowsy state. There was no seizure or seizure predisposition recorded on this study."  HOSPITAL COURSE:   Acute Alcoholic Pancreatitis with developing pseudocysts Patient was initially admitted to [facility] hospital. His lipase was significantly elevated in the 800s. He had improved by the time he was transferred here. He continued to have some abdominal discomfort. He was initially placed on liquid diet which was advanced. He underwent a repeat CT scans to further assess the pancreatic pseudocyst. General surgery was consulted and they did not feel there was any role for surgical intervention. Gastroenterology was consulted and they recommend monitoring the pseudocysts as an outpatient with periodic CT scans. They did not feel that he needs any procedure currently. If endoscopic procedure is required this  will need to be done in an academic center. Patient was prescribed pain medications.   Acute encephalopathy with myoclonic jerks/Wernicke's encephalopathy Patient was noted to be very confused. He was distracted. He had a few myoclonic jerks. EEG was done which did not show any epileptic activity. CT head was unremarkable. Neurology was consulted. It was felt that this was mostly Wernicke's encephalopathy. He was given high doses of thiamine. MRI was done which also did not show any acute process. Mental status has slowly improved. This will likely take a few more days to get back to baseline. HIV is nonreactive. RPR is nonreactive. TSH is normal.   H/o alcohol abuse His last alcoholic drink was more than 1 month ago. He was given Ativan as needed. He had admitted to some suicidal ideation over at [facility]. He was seen by psychiatry there and this was not thought to be a significant issue. He has not mentioned these ideations here. Psychiatry was consulted here as well and their input is appreciated. He was seen by Education officer, [facility] as well. He is interested in a residential rehabilitation program for alcohol. This will be pursued further as an outpatient. He did not require inpatient psychiatric hospitalization.  Normocytic anemia Anemia panel reviewed. No iron deficiency. Hemoglobin is stable. Lower hemoglobin than at admission is probably due to dilution. No overt bleeding was noted. Vitamin L-38 and folic acid were also not deficient.  Thrombocytosis Possibly related to acute inflammation. This is improving.   Overall, patient has improved. He has been told to stop drinking. His mental status will likely take a few more days to get back to baseline. All of the above was discussed with the patient's mother with the patient's permission. She will be taking him home with her.  She will pursue outpatient alcohol rehabilitation. Subsequently he was discharged today.   PERTINENT LABS:  The  results of significant diagnostics from this hospitalization (including imaging, microbiology, ancillary and laboratory) are listed below for reference.      Labs: Basic Metabolic Panel:  Recent Labs Lab 08/15/14 1647 08/16/14 0440 08/17/14 0507 08/18/14 0530 08/19/14 0550 08/20/14 0550  NA 136 134* 134* 137 140 137  K 4.8 4.3 4.0 3.9 3.8 3.9  CL 101 99 102 106 108 107  CO2 23 25 25 24 24 25   GLUCOSE 90 94 101* 111* 100* 102*  BUN 12 11 8  5* <5* <5*  CREATININE 0.69 0.72 0.73 0.64 0.73 0.75  CALCIUM 8.9 8.4 8.5 8.3* 8.1* 8.4  MG 1.9  --   --   --   --   --   PHOS 5.1*  --   --   --   --   --    Liver Function Tests:  Recent Labs Lab 08/15/14 1647 08/17/14 0507  AST 40* 25  ALT 40 33  ALKPHOS 72 60  BILITOT 0.7 0.5  PROT 8.1 7.2  ALBUMIN 2.8* 2.4*    Recent Labs Lab 08/15/14 1647 08/16/14 0440 08/17/14 0507 08/18/14 0530  LIPASE 89* 99* 96* 105*    Recent Labs Lab 08/16/14 0916  AMMONIA 19   CBC:  Recent Labs Lab 08/16/14 0440 08/17/14 0507 08/18/14 0530 08/19/14 0550 08/20/14 0550  WBC 16.8* 13.7* 11.4* 10.9* 10.3  HGB 9.0* 7.9* 8.2* 8.1* 8.3*  HCT 27.0* 24.2* 24.7* 24.2* 24.8*  MCV 98.5 98.4 98.0 98.0 96.9  PLT 844* 887* 829* 770* 712*    IMAGING STUDIES Ct Head Wo Contrast  08/16/2014   CLINICAL DATA:  Initial evaluation involuntary body spasms, incoherent, confused, garbled speech  EXAM: CT HEAD WITHOUT CONTRAST  TECHNIQUE: Contiguous axial images were obtained from the base of the skull through the vertex without intravenous contrast.  COMPARISON:  07/29/12  FINDINGS: Mild diffuse atrophy. No abnormal attenuation to suggest mass infarct hemorrhage or extra-axial fluid. No hydrocephalus. Calvarium intact. No inflammatory changes in the visualized sinuses or mastoid air cells. Study mildly limited by motion artifact.  IMPRESSION: No acute abnormalities. Mild atrophy somewhat disproportionate for age.   Electronically Signed   By: Skipper Cliche M.D.   On: 08/16/2014 13:37   Ct Abdomen W Contrast  08/15/2014   CLINICAL DATA:  Recent admission to [facility] for acute pancreatitis. Transferred to [facility] for further evaluation. Suspected pseudo cyst. Abdominal pain.  EXAM: CT ABDOMEN WITH CONTRAST  TECHNIQUE: Multidetector CT imaging of the abdomen was performed using the standard protocol following bolus administration of intravenous contrast.  CONTRAST:  1mL OMNIPAQUE IOHEXOL 300 MG/ML SOLN, 13mL OMNIPAQUE IOHEXOL 300 MG/ML SOLN  COMPARISON:  08/06/2014 from [facility].  FINDINGS: Small bilateral pleural effusions with basilar atelectasis, greater on the left.  Inflammatory infiltration around the pancreas consistent with history of pancreatitis. Multiple fluid collections are demonstrated in and around the pancreas consistent with pseudo cysts or walled-off necrosis. The largest collection is anterior and superior to the body and tail of the pancreas and deforms the greater curvature of the stomach. This lesion measures about 7.6 x 10.3 cm diameter. Another fluid collection is demonstrated anterior to the body of the pancreas inferiorly and displaces the transverse colon. This collection measures about 7.7 x 6.1 cm. Additional collection medial to the spleen measures about 2.5 x 5.6 cm. Additional collection along the left anterior  perirenal space and extending into the pericolic gutter anterior to the psoas muscle. This collection measures 7.2 x 5.3 cm by at least 18 cm in length. The inferior most extension of this fluid collection is not included within the field of view. Fluid collections are better defined than on previous study consistent with maturation of the fluid collections. Pancreatic parenchyma demonstrates homogeneous enhancement. No definite evidence of pancreatic necrosis although the arterial phase images were obtained for best evaluation.  Liver, spleen, gallbladder, adrenal glands, kidneys,  abdominal aorta, inferior vena cava, and retroperitoneal lymph nodes appear normal. Stomach is displaced by the pseudo cyst but is not abnormally distended. Visualized small bowel and colon are unremarkable. No free air in the abdomen. Subcutaneous lesion in the anterior abdominal wall subcutaneous fat consistent with sebaceous cyst.  IMPRESSION: Inflammatory changes consistent with acute pancreatitis. Multiple developing fluid collections consistent with developing pseudocysts. Small bilateral pleural effusions and basilar atelectasis, greater on the left.   Electronically Signed   By: Lucienne Capers M.D.   On: 08/15/2014 20:47   Mr Brain Wo Contrast  08/17/2014   CLINICAL DATA:  Myoclonus and altered mental status concurrent with acute pancreatitis. Blurry vision, worse on the RIGHT.  EXAM: MRI HEAD WITHOUT CONTRAST  TECHNIQUE: Multiplanar, multiecho pulse sequences of the brain and surrounding structures were obtained without intravenous contrast.  COMPARISON:  CT of the head August 16, 2014  FINDINGS: Mild motion degraded examination, however the coronal T2 is moderately motion degraded. The ventricles and sulci are upper limits of normal for patient's age. No abnormal parenchymal signal, mass lesions, mass effect. No reduced diffusion to suggest acute ischemia. No susceptibility artifact to suggest hemorrhage.  No abnormal extra-axial fluid collections. No extra-axial masses though, contrast enhanced sequences would be more sensitive. Normal major intracranial vascular flow voids seen at the skull base.  Ocular globes and orbital contents are unremarkable though not tailored for evaluation. No abnormal sellar expansion. Trace paranasal sinus mucosal thickening without air-fluid levels. The mastoid air cells appear well-aerated. No suspicious calvarial bone marrow signal. No abnormal sellar expansion. Craniocervical junction maintained.  IMPRESSION: Motion degraded examination without acute intracranial  process.  Mild nonspecific parenchymal brain volume loss for age can be seen with dehydration or acute illness.   Electronically Signed   By: Elon Alas   On: 08/17/2014 22:13   Dg Chest Port 1 View  08/16/2014   CLINICAL DATA:  PICC line placement  EXAM: PORTABLE CHEST - 1 VIEW  COMPARISON:  08/08/2014  FINDINGS: Right arm PICC terminates at the cavoatrial junction.  Lungs are essentially clear. Prominent interstitial marking. No pleural effusion or pneumothorax.  The heart is normal in size.  IMPRESSION: Right arm PICC terminates at the cavoatrial junction.   Electronically Signed   By: Julian Hy M.D.   On: 08/16/2014 10:53    DISCHARGE EXAMINATION: Filed Vitals:   08/19/14 0635 08/19/14 1410 08/19/14 2149 08/20/14 0547  BP: 110/70 117/69 121/77 116/70  Pulse:  87 71 73  Temp: 98.1 F (36.7 C) 97.6 F (36.4 C) 98.3 F (36.8 C) 99.1 F (37.3 C)  TempSrc: Oral Oral Oral Oral  Resp: 20 18 16 16   Height:      Weight:      SpO2: 98% 99% 98% 98%   General appearance: alert, cooperative, appears stated age and no distress Resp: clear to auscultation bilaterally Cardio: regular rate and rhythm, S1, S2 normal, no murmur, click, rub or gallop GI: soft, non-tender; bowel sounds normal;  no masses,  no organomegaly Neurologic: He is awake, alert, oriented 3. Mental status much improved than what it was a few days ago. Does not have any focal deficits.  DISPOSITION: Home with mother  Discharge Instructions    Call MD for:  difficulty breathing, headache or visual disturbances    Complete by:  As directed      Call MD for:  extreme fatigue    Complete by:  As directed      Call MD for:  persistant dizziness or light-headedness    Complete by:  As directed      Call MD for:  persistant nausea and vomiting    Complete by:  As directed      Call MD for:  severe uncontrolled pain    Complete by:  As directed      Call MD for:  temperature >100.4    Complete by:  As directed       Discharge diet:    Complete by:  As directed   Soft, bland diet. Low fat. Eat slowly. Chew properly before swallowing.     Discharge instructions    Complete by:  As directed   Please stop drinking alcohol. Please be sure to follow up with the doctor as instructed for the pancreas. Continue pursuing alcohol rehab.     Increase activity slowly    Complete by:  As directed            ALLERGIES:  Allergies  Allergen Reactions  . Erythromycin Rash     Current Discharge Medication List    START taking these medications   Details  folic acid (FOLVITE) 1 MG tablet Take 1 tablet (1 mg total) by mouth daily. Qty: 30 tablet, Refills: 2    Multiple Vitamin (MULTIVITAMIN WITH MINERALS) TABS tablet Take 1 tablet by mouth daily. Qty: 30 tablet, Refills: 2    ondansetron (ZOFRAN ODT) 4 MG disintegrating tablet Take 1 tablet (4 mg total) by mouth every 8 (eight) hours as needed for nausea or vomiting. Qty: 20 tablet, Refills: 0    oxyCODONE (OXY IR/ROXICODONE) 5 MG immediate release tablet Take 1-2 tablets (5-10 mg total) by mouth every 4 (four) hours as needed for moderate pain. Qty: 30 tablet, Refills: 0    thiamine 100 MG tablet Take 1 tablet (100 mg total) by mouth daily. Qty: 30 tablet, Refills: 2       Follow-up Information    Follow up with [facility], [place]. Schedule an appointment as soon as possible for a visit in 2 weeks.   Specialty:  Gastroenterology   Why:  to follow up for pancreatic pseudocysts   Contact information:   [address] [place] [address] [phone]       TOTAL DISCHARGE TIME: 21 minutes  [facility] Pager [phone]  08/20/2014, 8:57 AM

## 2014-08-20 NOTE — Discharge Instructions (Signed)
Alcohol Use Disorder Alcohol use disorder is a mental disorder. It is not a one-time incident of heavy drinking. Alcohol use disorder is the excessive and uncontrollable use of alcohol over time that leads to problems with functioning in one or more areas of daily living. People with this disorder risk harming themselves and others when they drink to excess. Alcohol use disorder also can cause other mental disorders, such as mood and anxiety disorders, and serious physical problems. People with alcohol use disorder often misuse other drugs.  Alcohol use disorder is common and widespread. Some people with this disorder drink alcohol to cope with or escape from negative life events. Others drink to relieve chronic pain or symptoms of mental illness. People with a family history of alcohol use disorder are at higher risk of losing control and using alcohol to excess.  SYMPTOMS  Signs and symptoms of alcohol use disorder may include the following:   Consumption ofalcohol inlarger amounts or over a longer period of time than intended.  Multiple unsuccessful attempts to cutdown or control alcohol use.   A great deal of time spent obtaining alcohol, using alcohol, or recovering from the effects of alcohol (hangover).  A strong desire or urge to use alcohol (cravings).   Continued use of alcohol despite problems at work, school, or home because of alcohol use.   Continued use of alcohol despite problems in relationships because of alcohol use.  Continued use of alcohol in situations when it is physically hazardous, such as driving a car.  Continued use of alcohol despite awareness of a physical or psychological problem that is likely related to alcohol use. Physical problems related to alcohol use can involve the brain, heart, liver, stomach, and intestines. Psychological problems related to alcohol use include intoxication, depression, anxiety, psychosis, delirium, and dementia.   The need  for increased amounts of alcohol to achieve the same desired effect, or a decreased effect from the consumption of the same amount of alcohol (tolerance).  Withdrawal symptoms upon reducing or stopping alcohol use, or alcohol use to reduce or avoid withdrawal symptoms. Withdrawal symptoms include:  Racing heart.  Hand tremor.  Difficulty sleeping.  Nausea.  Vomiting.  Hallucinations.  Restlessness.  Seizures. DIAGNOSIS Alcohol use disorder is diagnosed through an assessment by your health care provider. Your health care provider may start by asking three or four questions to screen for excessive or problematic alcohol use. To confirm a diagnosis of alcohol use disorder, at least two symptoms must be present within a 38-month period. The severity of alcohol use disorder depends on the number of symptoms:  Mild--two or three.  Moderate--four or five.  Severe--six or more. Your health care provider may perform a physical exam or use results from lab tests to see if you have physical problems resulting from alcohol use. Your health care provider may refer you to a mental health professional for evaluation. TREATMENT  Some people with alcohol use disorder are able to reduce their alcohol use to low-risk levels. Some people with alcohol use disorder need to quit drinking alcohol. When necessary, mental health professionals with specialized training in substance use treatment can help. Your health care provider can help you decide how severe your alcohol use disorder is and what type of treatment you need. The following forms of treatment are available:   Detoxification. Detoxification involves the use of prescription medicines to prevent alcohol withdrawal symptoms in the first week after quitting. This is important for people with a history of symptoms  of withdrawal and for heavy drinkers who are likely to have withdrawal symptoms. Alcohol withdrawal can be dangerous and, in severe cases,  cause death. Detoxification is usually provided in a hospital or in-patient substance use treatment facility.  Counseling or talk therapy. Talk therapy is provided by substance use treatment counselors. It addresses the reasons people use alcohol and ways to keep them from drinking again. The goals of talk therapy are to help people with alcohol use disorder find healthy activities and ways to cope with life stress, to identify and avoid triggers for alcohol use, and to handle cravings, which can cause relapse.  Medicines.Different medicines can help treat alcohol use disorder through the following actions:  Decrease alcohol cravings.  Decrease the positive reward response felt from alcohol use.  Produce an uncomfortable physical reaction when alcohol is used (aversion therapy).  Support groups. Support groups are run by people who have quit drinking. They provide emotional support, advice, and guidance. These forms of treatment are often combined. Some people with alcohol use disorder benefit from intensive combination treatment provided by specialized substance use treatment centers. Both inpatient and outpatient treatment programs are available. Document Released: 07/31/2004 Document Revised: 11/07/2013 Document Reviewed: 09/30/2012 [facility] Patient Information 2015 [place], [place]. This information is not intended to replace advice given to you by your health care provider. Make sure you discuss any questions you have with your health care provider.   Acute Pancreatitis Acute pancreatitis is a disease in which the pancreas becomes suddenly inflamed. The pancreas is a large gland located behind your stomach. The pancreas produces enzymes that help digest food. The pancreas also releases the hormones glucagon and insulin that help regulate blood sugar. Damage to the pancreas occurs when the digestive enzymes from the pancreas are activated and begin attacking the pancreas before being released  into the intestine. Most acute attacks last a couple of days and can cause serious complications. Some people become dehydrated and develop low blood pressure. In severe cases, bleeding into the pancreas can lead to shock and can be life-threatening. The lungs, heart, and kidneys may fail. CAUSES  Pancreatitis can happen to anyone. In some cases, the cause is unknown. Most cases are caused by:  Alcohol abuse.  Gallstones. Other less common causes are:  Certain medicines.  Exposure to certain chemicals.  Infection.  Damage caused by an accident (trauma).  Abdominal surgery. SYMPTOMS   Pain in the upper abdomen that may radiate to the back.  Tenderness and swelling of the abdomen.  Nausea and vomiting. DIAGNOSIS  Your caregiver will perform a physical exam. Blood and stool tests may be done to confirm the diagnosis. Imaging tests may also be done, such as X-rays, CT scans, or an ultrasound of the abdomen. TREATMENT  Treatment usually requires a stay in the hospital. Treatment may include:  Pain medicine.  Fluid replacement through an intravenous line (IV).  Placing a tube in the stomach to remove stomach contents and control vomiting.  Not eating for 3 or 4 days. This gives your pancreas a rest, because enzymes are not being produced that can cause further damage.  Antibiotic medicines if your condition is caused by an infection.  Surgery of the pancreas or gallbladder. HOME CARE INSTRUCTIONS   Follow the diet advised by your caregiver. This may involve avoiding alcohol and decreasing the amount of fat in your diet.  Eat smaller, more frequent meals. This reduces the amount of digestive juices the pancreas produces.  Drink enough fluids to keep  your urine clear or pale yellow.  Only take over-the-counter or prescription medicines as directed by your caregiver.  Avoid drinking alcohol if it caused your condition.  Do not smoke.  Get plenty of rest.  Check your  blood sugar at home as directed by your caregiver.  Keep all follow-up appointments as directed by your caregiver. SEEK MEDICAL CARE IF:   You do not recover as quickly as expected.  You develop new or worsening symptoms.  You have persistent pain, weakness, or nausea.  You recover and then have another episode of pain. SEEK IMMEDIATE MEDICAL CARE IF:   You are unable to eat or keep fluids down.  Your pain becomes severe.  You have a fever or persistent symptoms for more than 2 to 3 days.  You have a fever and your symptoms suddenly get worse.  Your skin or the white part of your eyes turn yellow (jaundice).  You develop vomiting.  You feel dizzy, or you faint.  Your blood sugar is high (over 300 mg/dL). MAKE SURE YOU:   Understand these instructions.  Will watch your condition.  Will get help right away if you are not doing well or get worse. Document Released: 06/23/2005 Document Revised: 12/23/2011 Document Reviewed: 10/02/2011 [facility] Patient Information 2015 [place], [place]. This information is not intended to replace advice given to you by your health care provider. Make sure you discuss any questions you have with your health care provider.

## 2014-08-25 ENCOUNTER — Emergency Department (HOSPITAL_COMMUNITY)
Admission: EM | Admit: 2014-08-25 | Discharge: 2014-08-25 | Disposition: A | Discharge status: Home / Self Care | Payer: Self-pay | Attending: Emergency Medicine | Admitting: Emergency Medicine

## 2014-08-25 ENCOUNTER — Encounter (HOSPITAL_COMMUNITY): Payer: Self-pay | Admitting: Emergency Medicine

## 2014-08-25 DIAGNOSIS — Z760 Encounter for issue of repeat prescription: Secondary | ICD-10-CM | POA: Insufficient documentation

## 2014-08-25 DIAGNOSIS — D649 Anemia, unspecified: Secondary | ICD-10-CM | POA: Insufficient documentation

## 2014-08-25 DIAGNOSIS — Z8659 Personal history of other mental and behavioral disorders: Secondary | ICD-10-CM | POA: Insufficient documentation

## 2014-08-25 DIAGNOSIS — Z8719 Personal history of other diseases of the digestive system: Secondary | ICD-10-CM | POA: Insufficient documentation

## 2014-08-25 DIAGNOSIS — R1084 Generalized abdominal pain: Secondary | ICD-10-CM | POA: Insufficient documentation

## 2014-08-25 DIAGNOSIS — Z79899 Other long term (current) drug therapy: Secondary | ICD-10-CM | POA: Insufficient documentation

## 2014-08-25 DIAGNOSIS — Z720 Tobacco use: Secondary | ICD-10-CM | POA: Insufficient documentation

## 2014-08-25 MED ORDER — OXYCODONE-ACETAMINOPHEN 5-325 MG PO TABS
2.0000 | ORAL_TABLET | Freq: Once | ORAL | Status: DC
  Filled 2014-08-25: qty 2

## 2014-08-25 MED ORDER — OXYCODONE-ACETAMINOPHEN 5-325 MG PO TABS
2.0000 | ORAL_TABLET | Freq: Once | ORAL | Status: AC
  Administered 2014-08-25: 2 via ORAL
  Filled 2014-08-25: qty 2

## 2014-08-25 MED ORDER — OXYCODONE HCL 5 MG PO TABS: 5.0000 mg | ORAL_TABLET | ORAL | Status: DC | PRN

## 2014-08-25 NOTE — Discharge Instructions (Signed)
Follow up with your regular doctor. Use pain medication as directed. Use home medications as directed. Return to the ER for changes or worsening symptoms.   Medication Refill, Emergency Department We have refilled your medication today as a courtesy to you. It is best for your medical care, however, to take care of getting refills done through your primary caregiver's office. They have your records and can do a better job of follow-up than we can in the emergency department. On maintenance medications, we often only prescribe enough medications to get you by until you are able to see your regular caregiver. This is a more expensive way to refill medications. In the future, please plan for refills so that you will not have to use the emergency department for this. Thank you for your help. Your help allows [place] to better take care of the daily emergencies that enter our department. Document Released: 10/10/2003 Document Revised: 09/15/2011 Document Reviewed: 09/30/2013 [facility] Patient Information 2015 [place], [place]. This information is not intended to replace advice given to you by your health care provider. Make sure you discuss any questions you have with your health care provider.  Low-Fat Diet for Pancreatitis or Gallbladder Conditions A low-fat diet can be helpful if you have pancreatitis or a gallbladder condition. With these conditions, your pancreas and gallbladder have trouble digesting fats. A healthy eating plan with less fat will help rest your pancreas and gallbladder and reduce your symptoms. WHAT DO I NEED TO KNOW ABOUT THIS DIET?  Eat a low-fat diet.  Reduce your fat intake to less than 20-30% of your total daily calories. This is less than 50-60 g of fat per day.  Remember that you need some fat in your diet. Ask your dietician what your daily goal should be.  Choose nonfat and low-fat healthy foods. Look for the words "nonfat," "low fat," or "fat free."  As a guide, look on  the label and choose foods with less than 3 g of fat per serving. Eat only one serving.  Avoid alcohol.  Do not smoke. If you need help quitting, talk with your health care provider.  Eat small frequent meals instead of three large heavy meals. WHAT FOODS CAN I EAT? Grains Include healthy grains and starches such as potatoes, wheat bread, fiber-rich cereal, and brown rice. Choose whole grain options whenever possible. In adults, whole grains should account for 45-65% of your daily calories.  Fruits and Vegetables Eat plenty of fruits and vegetables. Fresh fruits and vegetables add fiber to your diet. Meats and Other Protein Sources Eat lean meat such as chicken and pork. Trim any fat off of meat before cooking it. Eggs, fish, and beans are other sources of protein. In adults, these foods should account for 10-35% of your daily calories. Dairy Choose low-fat milk and dairy options. Dairy includes fat and protein, as well as calcium.  Fats and Oils Limit high-fat foods such as fried foods, sweets, baked goods, sugary drinks.  Other Creamy sauces and condiments, such as mayonnaise, can add extra fat. Think about whether or not you need to use them, or use smaller amounts or low fat options. WHAT FOODS ARE NOT RECOMMENDED?  High fat foods, such as:  [employer].  Ice cream.  [place] toast.  Sweet rolls.  Pizza.  Cheese bread.  Foods covered with batter, butter, creamy sauces, or cheese.  Fried foods.  Sugary drinks and desserts.  Foods that cause gas or bloating Document Released: 06/28/2013 Document Reviewed: 06/28/2013 [employer]  Patient Information 2015 [place]. This information is not intended to replace advice given to you by your health care provider. Make sure you discuss any questions you have with your health care provider.

## 2014-08-25 NOTE — ED Provider Notes (Signed)
CSN: [id number]     Arrival date & time 08/25/14  1407 History  This chart was scribed for non-physician practitioner, Zacarias Pontes, PA-C working with Hoy Morn, MD, by Erling Conte, ED Scribe. This patient was seen in room WTR7/WTR7 and the patient's care was started at 2:23 PM.      Chief Complaint  Patient presents with  . Medication Refill   The history is provided by the patient. No language interpreter was used.    HPI Comments: Brandon Oneill is a 34 y.o. male with a h/o acute alcoholic pancreatitis, alcoholism, and alcohol withdrawal seizures who presents to the Emergency Department for medication refill of his 5 mg oxycodone for pancreatitis pain. Pt was admitted to the hospital 10 days ago for acute pancreatitis and AMS and discharged 5 days ago with instructions to f/up with a PCP. Pt states that he has had abd pain over the past couple of days but it is the same as he has had in the past, and overall is improving. He reports that the oxycodone improves the pain. His last dose was around 6 hours ago, and he has run out (had oxycodone 5mg  instructed to take 1-2tabs q4h PRN, total #30 given). He does not want a workup for his abdominal pain today as he states it is the same as it has been in the past. He denies any nausea, vomiting, or diarrhea. Denies any other symptoms at this time.  Past Medical History  Diagnosis Date  . Acute alcoholic pancreatitis 26/8341    CT 08/15/14 shows evolving pseudocyts.    . Alcoholism 08/2014    Chronic. Detox admits x 3, one to [place].   . Anemia 08/2014    macrocytic  . Alcohol withdrawal seizure 2013  . Delirium tremens 07/2014   Past Surgical History  Procedure Laterality Date  . Incision and drainage abscess  2008    located on low back   Family History  Problem Relation Age of Onset  . Alcoholism Father    History  Substance Use Topics  . Smoking status: Current Every Day Smoker -- 0.50 packs/day for 20 years   Types: Cigarettes  . Smokeless tobacco: Never Used  . Alcohol Use: No     Comment: 04/2014: [facility] RX'd Naltrexone, not sure if he ever used this.     Review of Systems  Constitutional: Negative for fever and chills.  Gastrointestinal: Positive for abdominal pain (same as previously, improving overall). Negative for nausea, vomiting, diarrhea and blood in stool.  Musculoskeletal: Negative for back pain.  Skin: Negative for color change.  Neurological: Negative for weakness and headaches.  10 Systems reviewed and all are negative for acute change except as noted in the HPI.       Allergies  Erythromycin  Home Medications   Prior to Admission medications   Medication Sig Start Date End Date Taking? Authorizing Provider  folic acid (FOLVITE) 1 MG tablet Take 1 tablet (1 mg total) by mouth daily. 08/20/14   Bonnielee Haff, MD  Multiple Vitamin (MULTIVITAMIN WITH MINERALS) TABS tablet Take 1 tablet by mouth daily. 08/20/14   Bonnielee Haff, MD  ondansetron (ZOFRAN ODT) 4 MG disintegrating tablet Take 1 tablet (4 mg total) by mouth every 8 (eight) hours as needed for nausea or vomiting. 08/20/14   Bonnielee Haff, MD  oxyCODONE (OXY IR/ROXICODONE) 5 MG immediate release tablet Take 1-2 tablets (5-10 mg total) by mouth every 4 (four) hours as needed for moderate pain. 08/20/14  Bonnielee Haff, MD  thiamine 100 MG tablet Take 1 tablet (100 mg total) by mouth daily. 08/21/14   Bonnielee Haff, MD   Triage Vitals: BP 135/80 mmHg  Pulse 112  Temp(Src) 98.3 F (36.8 C) (Oral)  Resp 18  SpO2 98%  Manual HR: 91  Physical Exam  Constitutional: He is oriented to person, place, and time. Vital signs are normal. He appears well-developed and well-nourished.  Non-toxic appearance. No distress.  Afebrile, nontoxic, NAD, VSS (initially reported as tachycardic, on manual check HR 91)  HENT:  Head: Normocephalic and atraumatic.  Mouth/Throat: Mucous membranes are normal.  Eyes: Conjunctivae and EOM are  normal. Right eye exhibits no discharge. Left eye exhibits no discharge.  Neck: Normal range of motion. Neck supple.  Cardiovascular: Normal rate.   Initially reported as tachycardic, manual check finds HR 91  Pulmonary/Chest: Effort normal. No respiratory distress.  Abdominal: Soft. Normal appearance and bowel sounds are normal. He exhibits no distension. There is generalized tenderness. There is no rigidity, no rebound, no guarding, no CVA tenderness, no tenderness at McBurney's point and negative Murphy's sign.  Soft, ND, +BS throughout, with very mild diffuse generalized abd TTP, no r/g/r, neg murphy's, neg mcburney's, no CVA TTP   Musculoskeletal: Normal range of motion.  Neurological: He is alert and oriented to person, place, and time. He has normal strength. No sensory deficit.  Skin: Skin is warm, dry and intact. No rash noted.  Psychiatric: He has a normal mood and affect. His behavior is normal.  Nursing note and vitals reviewed.   ED Course  Procedures (including critical care time)\  DIAGNOSTIC STUDIES: Oxygen Saturation is 98% on RA, normal by my interpretation.    COORDINATION OF CARE:    Labs Review Labs Reviewed - No data to display  Imaging Review No results found.   EKG Interpretation None      MDM   Final diagnoses:  Encounter for medication refill  History of pancreatitis    34 y.o. male here for med refill of pain meds. Discharged home with 30tabs of oxycodone 5mg  with instructions to take 1-2 tabs q4h PRN pain, therefore he's actually lasted more time than expected since he's been taking 2tabs q4h. Nonperitoneal abd exam, pt stating this is improved overall and doesn't want work up. He is following the instructions given to him with regards to food intake, and tolerating PO well. Initially found to be tachycardic but manual recheck found HR 91. Will give pain meds here, and he's tolerating PO well here, therefore will refill oxycodone and d/c home with  instructions to f/up with his PCP which he is in the process of finding. I explained the diagnosis and have given explicit precautions to return to the ER including for any other new or worsening symptoms. The patient understands and accepts the medical plan as it's been dictated and I have answered their questions. Discharge instructions concerning home care and prescriptions have been given. The patient is STABLE and is discharged to home in good condition.   I personally performed the services described in this documentation, which was scribed in my presence. The recorded information has been reviewed and is accurate.  BP 135/80 mmHg  Pulse 91  Temp(Src) 98.3 F (36.8 C) (Oral)  Resp 18  SpO2 98%  Meds ordered this encounter  Medications  . oxyCODONE-acetaminophen (PERCOCET/ROXICET) 5-325 MG per tablet 2 tablet    Sig:   . oxyCODONE (ROXICODONE) 5 MG immediate release tablet    Sig:  Take 1 tablet (5 mg total) by mouth every 4 (four) hours as needed for moderate pain or severe pain.    Dispense:  30 tablet    Refill:  0    Order Specific Question:  Supervising Provider    Answer:  Johnna Acosta [address] [place], PA-C 08/25/14 [place], MD 08/25/14 1525

## 2014-08-25 NOTE — ED Notes (Signed)
Pt with Hx of alcoholic pancreatitis visits ED for refill of oxycodone 5 mg for pancreatitis pain. Pt denies need for evaluation or workup of pain, states he is here only for medication refill.

## 2014-10-27 NOTE — H&P (Signed)
PATIENT NAME:  Brandon Oneill, Brandon Oneill MR#:  [id number] DATE OF BIRTH:  1981/04/08  DATE OF EVALUATION:  11/10/2012  IDENTIFYING INFORMATION AND CHIEF COMPLAINT:  A 34 year old man presented himself to the Emergency Room requesting alcohol detox.   CHIEF COMPLAINT:  "I got to get off the alcohol."   HISTORY OF PRESENT ILLNESS:  Information obtained from the patient and the chart. The patient says that he has been drinking a standard amount of 6 x 40-ounce bottles of malt liquor every single day without fail for years. The amount has gradually increased a bit over time, but of late has been very stable. He appears to be very meticulous in always consuming exactly the same amount of alcohol. He says that his abdomen has started to hurt and he is feeling like he is nauseated and retching much of the time which to him is a signal that he may be getting pancreatitis. This is his rationale for detoxing. He denies that he abuses any other drugs. He says that his mood generally is okay. Denies depression. Denies suicidal or homicidal ideation.   PAST PSYCHIATRIC HISTORY:  No previous psychiatric history. He has tried to stop drinking on his own before and has had a seizure on at least one occasion. No history of delirium tremens. No history of suicide attempts or violence. No other mental health hospitalizations.   PAST MEDICAL HISTORY:  The patient has had hospitalization here for tachycardia and had been placed on Cardizem in the past. He does not have a past history of pancreatitis. No other significant standing medical problems.   SOCIAL HISTORY:  The patient lives alone. 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 He drinks exactly the same amount of alcohol every single day after he gets off from work. He has minimal social life.  No girlfriend.   FAMILY HISTORY:  Father is an alcoholic who had DTs and seems to have had pretty bad physical symptoms.   CURRENT MEDICATIONS:  He appears to not be taking  anything as he does not seem to still be on the Cardizem.   ALLERGIES:  ERYTHROMYCIN.   REVIEW OF SYSTEMS:  Complains of coughing and gagging frequently every evening. Feels sick to his stomach. Also having pain right in his abdomen. Not having hallucinations. Denies suicidal or homicidal ideation.   MENTAL STATUS EXAMINATION:  Reasonably well-groomed man, cooperative with the interview. Good eye contact. Psychomotor activity normal. Speech is normal in rate, tone and volume. Affect is anxious, but reactive. Mood is stated as being okay. Thoughts appeared to be generally lucid. No obvious delusions or loosening of associations. Denies auditory or visual hallucinations. Denies suicidal or homicidal ideation. Short and long-term memory grossly intact. Intelligence normal.   PHYSICAL EXAMINATION: GENERAL:  The patient looks like he is in some discomfort in his abdomen, bent over, does cough and gag a few times but is not throwing up. His pupils are equal and reactive.  SKIN:  Flushed, but no acute lesions. FACE:  Symmetric. NECK AND BACK:  Nontender.  MUSCULOSKELETAL: Full range of motion at extremities. GAIT:  Within normal limits. STRENGTH AND REFLEXES:  Normal and symmetric throughout. CRANIAL NERVES:  Normal and symmetric.  LUNGS:  Clear with no wheezes.  ABDOMEN: Decreased bowel sounds. Tenderness to light palpation, especially above the umbilicus.  HEART:  Regular rate and rhythm.  CURRENT VITAL SIGNS:  Show a temperature of 97.8, pulse 85, respirations 18, blood pressure 121/68.   LABORATORY RESULTS:  The  patient's urinalysis is normal. Drug screen negative. Lipase elevated at 599. TSH normal at 1.73. Alcohol level at about noon today was 169. BUN low at 4.  ALT elevated to 69, AST elevated to 97, total protein elevated at 9. CBC unremarkable.   ASSESSMENT: A 34 year old man with alcohol dependence. No other acute psychiatric problems, needs detox because of a history of seizures in the  past. Also needs evaluation for possibility of pancreatitis.   TREATMENT PLAN:  The patient will be admitted to psychiatry. Initially we had planned to try to have him go to [facility], but now they are telling us they do not have a bed. He will be put on the standing detox protocol. Vital signs will be managed. He will be engaged in group and individual therapy and we can discuss further rehab treatment as the days go on.   DIAGNOSIS, PRINCIPAL AND PRIMARY:  AXIS I:  Alcohol dependence.   SECONDARY DIAGNOSES: AXIS I:  No further diagnosis.   AXIS II:  No diagnosis.   AXIS III:  Alcohol withdrawal, history of tachycardia.   AXIS IV:  Moderate to severe from chronic social isolation and lack of resources.   AXIS V:  Functioning at time of evaluation is about 35.    ____________________________ Gonzella Lex, MD jtc:ce D: 11/10/2012 17:07:51 ET T: 11/10/2012 17:26:44 ET JOB#: [id number]  cc: Gonzella Lex, MD, <Dictator> Gonzella Lex MD ELECTRONICALLY SIGNED 11/10/2012 19:44

## 2014-10-27 NOTE — Discharge Summary (Signed)
PATIENT NAME:  Brandon Oneill, Brandon Oneill MR#:  [id number] DATE OF BIRTH:  Nov 13, 1980  DATE OF ADMISSION:  11/10/2012 DATE OF DISCHARGE:  11/15/2012  HOSPITAL COURSE: See dictated history and physical for details of admission. This 34 year old man was admitted to the hospital voluntarily after presenting to the Emergency Room requesting alcohol detox. He gave a history of drinking 6 of the 40-ounce bottles of malt liquor every day pretty consistently for years. He had realized that it was starting to make him more physically sick and inhibiting him emotionally and wanted to get detoxed. In the hospital, he was treated for alcohol withdrawal and had mild symptoms of withdrawal that were easily managed. He became more interactive and attended groups in an appropriate manner. Mood was seen to improve, and his optimism improved. The patient was able to discuss his chronic concerns about his depression and anxiety relating to his family in an appropriate manner. He did not report any suicidal ideation. At the time of discharge, he was expressing optimism about outpatient follow up treatment.   LABORATORY RESULTS: Admission labs showed a red blood cell count slightly low at 4.26, otherwise normal blood counts. BUN low at 4.0. Liver enzyme showed ALT elevated to 69, AST elevated to 97, total protein elevated at 9.0. Initial alcohol level 169. TSH 1.7. Urinalysis unremarkable. Drug screen negative. Lipase elevated at 599. Amylase normal. Lipase came down to 469 within the next day and was not checked any further.   DISCHARGE MEDICATIONS: Trazodone 100 mg once a day at night for sleep, citalopram 20 mg a day for depression.   MENTAL STATUS AT DISCHARGE: Neatly dressed and groomed man looks his stated age, cooperative with the interview. Good eye contact, normal psychomotor activity. Speech normal in rate, tone and volume. Affect euthymic, reactive and appropriate. Mood stated as being pretty good. Thoughts are lucid. No  evidence of loosening of associations or delusions. Denies auditory or visual hallucinations. Denies any suicidal or homicidal ideation. Shows improved judgment and insight. Normal intelligence.   DISPOSITION: Discharge home. Follow up with [facility].     DIAGNOSIS, PRINCIPAL AND PRIMARY:  AXIS I: Alcohol dependence.   SECONDARY DIAGNOSES: AXIS I: Major depression, mild chronic versus posttraumatic stress disorder.   AXIS II: Deferred.   AXIS III: Alcohol withdrawal and alcohol-induced hepatitis, resolved.   AXIS IV: Moderate-to-severe from chronic anger at his family and lack of resources.   AXIS V: Functioning at time of discharge 60.    ____________________________ Gonzella Lex, MD jtc:cb D: 11/29/2012 21:46:28 ET T: 11/29/2012 22:32:38 ET JOB#: [id number]  cc: Gonzella Lex, MD, <Dictator> Gonzella Lex MD ELECTRONICALLY SIGNED 11/30/2012 9:26

## 2014-10-27 NOTE — Consult Note (Signed)
Brief Consult Note: Diagnosis: alcohol dependence.   Patient was seen by consultant.   Recommend further assessment or treatment.   Orders entered.   Comments: Psychiatry: Patient to be admitted to psychhiatry for detox. Orders done. See note.  Electronic Signatures: Gonzella Lex (MD)  (Signed 07-May-14 16:55)  Authored: Brief Consult Note   Last Updated: 07-May-14 16:55 by Gonzella Lex (MD)

## 2014-11-05 NOTE — Consult Note (Signed)
Psychiatry: Follow-up for this patient who completed going through delirium tremens treatment but continues to have pain from pancreatitis.  Patient has no new complaints today.  Mood is feeling okay except for the distress he feels from his pain.  Continues to complain of a lot of pain in his abdomen.  He is alert and oriented and lucid.  Evidence of no suicidality.  No longer appears psychotic.  He still gets a little bit confused in his speech but not nearly as much as previously. change to treatment.  No indication to start psychiatric medicine.  No longer requires detox medicine.  Patient knows that he needs to stop drinking which is a longer term project after he gets medically well enough to just get out of the hospital.  Supportive counseling done.  Diagnosis: Delirium due to alcohol withdrawal resolving  Electronic Signatures: Ruie Sendejo, Madie Reno (MD)  (Signed on 05-Feb-16 17:06)  Authored  Last Updated: 05-Feb-16 17:06 by Gonzella Lex (MD)

## 2014-11-05 NOTE — Consult Note (Signed)
Patient continues to be intermittently delirious but much less so than yesterday.  Calmer.  Able to carry on a more lucid conversation.  Still tachycardic but less so.  Still having severe abdominal pain not able to take much by mouth. protocol for detox.  Continue current sitter for delirium.  Electronic Signatures: Gonzella Lex (MD)  (Signed on 29-Jan-16 21:21)  Authored  Last Updated: 29-Jan-16 21:21 by Gonzella Lex (MD)

## 2014-11-05 NOTE — Consult Note (Signed)
PATIENT NAME:  Brandon Oneill, MAH MR#:  [id number] DATE OF BIRTH:  [birth year]  DATE OF CONSULTATION:  07/31/2014  REFERRING PHYSICIAN:   CONSULTING PHYSICIAN:  Manya Silvas, MD  REASON FOR CONSULTATION: The patient is a 34 year old white male who is a chronic alcoholic who primarily drinks beer. The medical record says 6 of the 40-ounce beers a day. He was seen in the ER because of abdominal pain and vomiting, and had severe pancreatitis. He was admitted to the hospital. I was asked to see him in consultation because of his severe pancreatitis. The patient was moved from the floor to the unit because of the severity of his disease and his alcohol withdrawal. I was asked to see him in consultation.   The patient has been sedated with Ativan and I do get some history but am not able to take a complete history. He complains of abdominal pain across the upper abdomen. He has had some pain in his abdomen for several months, but this is much worse. He had vomiting x 2, with no hematemesis. Supposedly, there was some dark blood in the stools. He denies this at this time, and he certainly is a questionable historian. He denies any known heart disease, although records show he had previous atrial fibrillation.   ALLERGIES: ERYTHROMYCIN.   CURRENT MEDICATIONS: He is not taking any current outpatient medicines.   FAMILY HISTORY: Father with alcoholic cirrhosis.   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   REVIEW OF SYSTEMS: Review of systems is very difficult to take, given the patient's lethargy. Please check the hospitalist's review of systems.   PHYSICAL EXAMINATION:  VITAL SIGNS: Temperature 98.3, pulse 140, blood pressure 134/98, oxygen saturation 98% on 2 L. Respirations 24 to 28.  HEENT: Sclerae non icteric. The head is atraumatic. The patient is wearing glasses.  CHEST: Clear in the anterior fields.   HEART: Significant tachycardia.  ABDOMEN: Abdomen shows minimal if any bowel sounds. There is tenderness across the upper abdomen that increases when he takes a deep breath. I cannot palpate any hepatosplenomegaly.   LABORATORY DATA: Lipase on admission 3550; today it is 4368. Calcium dropped from 8.2 to 6.6. Glucose 124, BUN 11, creatinine 1.11, sodium 139, potassium 3.9, chloride 108, CO2 of 22. Triglycerides 283. Total protein 6.1, albumin 2.4, total bilirubin is 2.0 and on admission was 0.8. Alkaline phosphatase 97, SGOT 70, SGPT 44. White count on admission was 9.9; today it is 24. Hemoglobin has gone from 15.2 to 16.3. Platelet count 158,000.   CAT scan shows changes consistent with pancreatitis, diffusely edematous pancreas with peripancreatic edema and inflammation, no evidence yet of pancreatic necrosis or pancreatic abscess or pseudocyst. There is moderate abdominal and pelvic ascites with fairly extensive inflammation within the extraperitoneal tissues of the abdomen and pelvis, indicating some degree of chronicity to this ascites.   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 He has been moved to the unit to facilitate care for him due to his desire to want to get out of the bed and some degree of concern that he may proceed down a steep path due to the severity of his pancreatitis and require services such as intubation and ventilator support. It is of some concern that his hematocrit has risen during his first 24 hours here, as well as the lipase. There is some concern about possible impending acute tubular  necrosis. I will follow with you. I would try to give, multivitamins IV, if these can be obtained; they have been in short supply for the last several months, and would get a magnesium level with his next blood draw.    ____________________________ Manya Silvas, MD rte:MT D: 07/31/2014 17:02:09 ET T: 07/31/2014 17:38:24  ET JOB#: [id number]  cc: Manya Silvas, MD, <Dictator> Manya Silvas MD ELECTRONICALLY SIGNED 08/17/2014 14:38

## 2014-11-05 NOTE — Consult Note (Signed)
Chief Complaint:  Subjective/Chief Complaint seen for pancreatitis.  no obvious discomfort on initial visit, but then states pain is 8/10.  no n/v, eating clears without difficulty, passing flatus. intermittant low grade fever.   VITAL SIGNS/ANCILLARY NOTES: **Vital Signs.:   08-Feb-16 15:53  Vital Signs Type Routine  Temperature Temperature (F) 99.2  Celsius 37.3  Temperature Source oral  Pulse Pulse 98  Respirations Respirations 19  Systolic BP Systolic BP 885  Diastolic BP (mmHg) Diastolic BP (mmHg) 66  Mean BP 81  Pulse Ox % Pulse Ox % 97  Pulse Ox Activity Level  At rest  Oxygen Delivery Room Air/ 21 %   Brief Assessment:  Cardiac Regular   Respiratory clear BS   Gastrointestinal details normal No rebound tenderness  positive bowel sounds, some occasional higher pitch, diffuse abdominal discomfort, no rebound, fullness to palpation upper abdomen, voluntary guarding.   Lab Results: Routine Micro:  07-Feb-16 07:42   Culture Comment NO GROWTH IN 18-24 HOURS  Result(s) reported on 14 Aug 2014 at 07:00AM.    07:47   Culture Comment NO GROWTH IN 18-24 HOURS  Result(s) reported on 14 Aug 2014 at 07:00AM.  Routine Chem:  08-Feb-16 05:03   Lipase  717 (Result(s) reported on 14 Aug 2014 at 05:44AM.)  Glucose, Serum  121  BUN 10  Creatinine (comp) 0.84  Sodium, Serum  135  Potassium, Serum 4.5  Chloride, Serum 100  CO2, Serum 30  Calcium (Total), Serum 8.8  Anion Gap  5  Osmolality (calc) 270  eGFR (African American) >60  eGFR (Non-African American) >60 (eGFR values <55m/min/1.73 m2 may be an indication of chronic kidney disease (CKD). Calculated eGFR, using the MRDR Study equation, is useful in  patients with stable renal function. The eGFR calculation will not be reliable in acutely ill patients when serum creatinine is changing rapidly. It is not useful in patients on dialysis. The eGFR calculation may not be applicable to patients at the low and high extremes  of body sizes, pregnant women, and vegetarians.)  Phosphorus, Serum 4.9 (Result(s) reported on 14 Aug 2014 at 05:45AM.)  Magnesium, Serum 2.2 (1.8-2.4 THERAPEUTIC RANGE: 4-7 mg/dL TOXIC: > 10 mg/dL  -----------------------)   Radiology Results: CT:    31-Jan-16 13:49, CT Abdomen With Contrast  CT Abdomen With Contrast   REASON FOR EXAM:    pancreatitis;    NOTE: Nursing to Give Oral CT   Contrast  COMMENTS:       PROCEDURE: CT  - CT ABDOMEN STANDARD W  - Aug 06 2014  1:49PM     CLINICAL DATA:  34year old male with abdominal pain and some  nausea. History of pancreatitis.    EXAM:  CT ABDOMEN WITH CONTRAST    TECHNIQUE:  Multidetector CT imaging of the abdomen was performed using the  standard protocol following bolus administration of intravenous  contrast.    CONTRAST:  100 mm of Omnipaque 300.    COMPARISON:CT of the abdomen and pelvis 07/31/2014.    FINDINGS:  Lower chest: Extensive areas of atelectasis in the lower lobes of  the lungs bilaterally. Small bilateral pleural effusions layering  dependently.    Hepatobiliary: Diffuse decreased attenuation throughout the hepatic  parenchyma, compatible with hepatic steatosis. No discrete cystic or  solid hepatic lesions. No intra or extrahepatic biliary ductal  dilatation. Gallbladder is nearly completely decompressed.  Pancreas: Pancreatic parenchyma enhances normally. There continues  to be a large amount of peripancreatic fluid and inflammatory  changes, which has increased  compared to prior study from  07/31/2014. This inflammatory fluid extends throughout the  retroperitoneum, but also extends anteriorly into the root of the  small bowel mesentery into the transverse mesocolon. A very large  collection of fluid is also noted underneath the fundus and body of  the stomach, which appears more organized than the other  collections, estimated to measure approximately 13.8 x 13.2 x 5.3  cm, likely a developing  pseudocyst. Large amount of fluid in the  retroperitoneum on the left side displaces the descending colon  anteriorly. The inferior extent of this collection is not imaged, as  the pelvis was not included on today's examination.    Spleen: Small volume of perisplenic ascites. Otherwise,  unremarkable.    Adrenals/Urinary Tract: Bilateral kidneys and bilateral adrenal  glands are normal in appearance. No hydroureteronephrosis to the  visualized portions of the abdomen.    Stomach/Bowel: The fundus and body of the stomach are distorted by  the underlying peripancreatic fluid collection. Stomach is otherwise  unremarkable in appearance. No pathologic dilatation of small bowel  or colon. Gaseous distention of the cecum.    Vascular/Lymphatic: No significant atherosclerotic disease or  definite aneurysm identified in the abdominal vasculature. Numerous  borderline enlarged retroperitoneal lymph nodes are nonspecific, but  presumably reactive.  Other: Small volume of ascites.  No pneumoperitoneum.    Musculoskeletal: 2.4 x 1.3 cm low-attenuation lesion in the  subcutaneous fat of the lower chest near the midline likely  represents a large sebaceous cyst (unchanged). There areno  aggressive appearing lytic or blastic lesions noted in the  visualized portions of the skeleton.     IMPRESSION:  1. Worsening pancreatic and peripancreatic inflammatory changes,  compatible with worsening pancreatitis. No signs of frank pancreatic  necrosis on today's examination. However, there is extensive  peripancreatic inflammatory exudate throughout the retroperitoneum,  small bowel mesentery and transverse mesocolon, with what appears to  be a very large developing pseudocyst along the inferior surface of  the fundus and proximal body of the stomach.  2. Small bilateral pleural effusions with extensive atelectasis in  the lower lobes of the lungs bilaterally.  3. Additional incidental findings, as  above.      Electronically Signed    By: Vinnie Langton M.D.    On: 08/06/2014 15:16         Verified By: Etheleen Mayhew, M.D.,   Assessment/Plan:  Assessment/Plan:  Assessment 1) recurrent etoh related pancreatitis.  stable.  continues with abdominal pain in the setting of development of large pseudocyst.  8 days since last ct.  awaiting transfer to [facility] (patient preference) for definitive treatment of the pseudocyst.   Plan 1) continue abx. now on clears, would continue tpn for caloric purposes.   Also recommend repeat imaging.  Although [place] may be ok, bettter to repeat CT with contrast for comparison purposes.  It may be necessary to consider transfer to alternative tertiary care facility-[facility], [facility],   following.   Electronic Signatures: Loistine Simas (MD)  (Signed 08-Feb-16 17:31)  Authored: Chief Complaint, VITAL SIGNS/ANCILLARY NOTES, Brief Assessment, Lab Results, Radiology Results, Assessment/Plan   Last Updated: 08-Feb-16 17:31 by Loistine Simas (MD)

## 2014-11-05 NOTE — Consult Note (Signed)
Psychiatry: Follow-up for this patient having delirium tremens and pancreatitis.  Continues to be in the critical care unit.  I note that he is now on a Precedex drip.  When interviewed chart reviewed.  Patient is irritable and grumpy.  The only thing he wanted to ask was how quickly he could get out of the hospital.  Didn't show any understanding of really why he was here or what discharge would entail.  He was trying to pick at his lines as soon as he woke up.  Thoughts appear to be confused affect grumpy. continues to be very elevated.  He is being allowed liquid diet but is complaining about not being able to eat anything.  Thoughts seem to be confused and scattered. delirium tremens.  I ordered a 1 time bolus of 4 mg of Ativan after seeing him because of the transient increase in his agitation.  Continue management in the critical care unit for delirium while waiting for his pancreatitis to improve.  I will continue to follow up. delirium related to withdrawal from alcohol.  Alcohol abuse severe  Electronic Signatures: Gonzella Lex (MD)  (Signed on 26-Jan-16 21:16)  Authored  Last Updated: 26-Jan-16 21:16 by Gonzella Lex (MD)

## 2014-11-05 NOTE — Consult Note (Signed)
Pt in DT's and requiring meds for this Percedex and Ativan.  Hallucinations, agitated, Chest clear anterior field, abd with minimal bowel sounds, Lipase down to 1910, hgb down due to hydration.  OK to change oral PPI to iv bid, see calcium fall in pancreatitis, sopanofication in abdomen occurs some times.  Prognosis guarded. No new suggestions.  Electronic Signatures: Manya Silvas (MD)  (Signed on 26-Jan-16 13:43)  Authored  Last Updated: 26-Jan-16 13:43 by Manya Silvas (MD)

## 2014-11-05 NOTE — Discharge Summary (Signed)
PATIENT NAME:  Brandon Oneill, Brandon Oneill MR#:  [id number] DATE OF BIRTH:  10-28-1980  DATE OF ADMISSION:  07/30/2014 DATE OF DISCHARGE:  08/09/2014   TRANSFER SUMMARY   ADMITTING DIAGNOSIS: Abdominal pain.   TRANSFER DIAGNOSES  1.  Acute abdominal pain due to acute pancreatitis with development of a large pancreatic pseudocyst with severe pancreatic inflammation. Will likely need EUS drainage. Transfer is arranged to [place].   2.  Delirium tremens, which were severe; improved.  3.  Fever, likely due to severe pancreatitis; patient on Invanz.  4   Fluid overload, treated with Lasix.  5.  Alcohol abuse. 6.  Elevated liver function tests due to alcohol abuse.  7.  Suicidal ideation with anxiety and depression, seen by psychiatry; suicidal ideation now resolved.  8.  Accelerated hypertension, improved with p.o. Lopressor.  9.  Electrolyte imbalances, being replaced.   CONSULTANTS: Gonzella Lex, MD of psychiatry. Lollie Sails, MD. Manya Silvas, MD.   PERTINENT LABORATORY EVALUATIONS: Admitting glucose 137, BUN 11, creatinine 0.93, sodium 136, potassium 4.0, chloride 103, CO2 of 23. Calcium was 8.2. LFTs showed albumin of 3.0. AST was 95, ALT 68. WBC 9.9, hemoglobin 15.2, platelet count was 189,000. Blood cultures x2 with no growth. Most recent glucose on 08/09/2014 of 141, BUN 9, creatinine 0.91, sodium 137, potassium 3.7, chloride 103, CO2 of 26. Lipase is 1081. Ammonia level was 30.   CT scan of the abdomen with contrast on 08/06/2014 showed worsening pancreatic and peripancreatic inflammation changes compatible with worsening pancreatitis. No pancreatic necrosis. Extensive pancreatic inflammatory exudate throughout the retroperitoneal. Small bowel mesentery and transverse mesocolon which appear to be a very large developing pseudocysts along the inferior surface of the fundus of the proximal body of the stomach, and small bilateral pleural effusions with extensive  atelectasis.   HOSPITAL COURSE: Please refer to H and P done by the admitting physician. The patient is a 34 year old white male with significant alcohol abuse, who presented to the hospital with abdominal pain. Initially was noted to have acute pancreatitis. His initial CT scan just showed some inflammation. He was kept n.p.o. on supportive care. He did have severe delirium tremens, and he was kept in the ICU. The patient was slowly advanced to his diet; however, he continued to complain of pain. A repeat CT scan was done, which showed severe inflammation as described above. GI came and evaluated the patient; they recommended that he needs drainage of this pseudocyst. Therefore, arrangements are being made for him to be transferred to [facility].   The patient's delirium tremors were also improved. He did show some confusion, due to severe alcohol abuse. The patient also initially was complaining of depression and suicidal ideation, and was followed by psychiatry.   At this time, arrangements for transfer are made. Currently is on TPN, due to poor candidate due to his mental status for having an NG tube in place.   MEDICATIONS AT THE TIME OF TRANSFER: Zofran 4 mg IV q. 4 hours p.r.n., oxycodone 5 mg q. 4 hours p.r.n., calcium carbonate 1000 mg p.o. q.i.d., Dilaudid 2 mg IV q. 3 hours p.r.n. for severe pain, Protonix 40 IV q. 12 hours, magnesium oxide 400 q. 8 hours, Lasix 20 IV q. 12 hours, lorazepam 2 mg IV q. 1 hour p.r.n., metoprolol succinate 50 mg daily, nicotine 1 cartridge inhalation as needed, Lovenox 40 mg subcutaneously q. 24 hours, Invanz 1 g IV q. 24 hours, insulin sliding scale.   TIME SPENT:  35 minutes.     ____________________________ Lafonda Mosses Posey Pronto, MD shp:MT D: 08/09/2014 13:40:16 ET T: 08/09/2014 14:38:14 ET JOB#: [id number]  cc: Kayon Dozier H. Posey Pronto, MD, <Dictator> Alric Seton MD ELECTRONICALLY SIGNED 08/10/2014 12:37

## 2014-11-05 NOTE — Discharge Summary (Signed)
PATIENT NAME:  Brandon Oneill, Brandon Oneill MR#:  [id number] DATE OF BIRTH:  18-Feb-1981  DATE OF ADMISSION:  07/30/2014 DATE OF DISCHARGE:  08/15/2014  ADDENDUM  FINAL DIAGNOSES:  1. Acute severe pancreatitis with pseudocyst formation and fever, could be an infected pseudocyst.  2. Delirium tremens, which resolved.  3. Depression.  4. Accelerated hypertension.   CURRENT MEDICATIONS: Include TPN at 100 mL an hour, Zofran 4 mg IV push q.4 h. p.r.n. nausea or vomiting, calcium carbonate 1000 mg orally q.i.d., Dilaudid injection 2 mg every 3 hours as needed for severe pain, magnesium oxide 400 mg orally q.8 h., metoprolol ER 50 mg daily, Nicotrol oral inhaler kit 1 cartridge inhalation every 1 hour, Lovenox 40 mg subcutaneous injection q.24 h., ertapenem 1 gram IV q.24 h., NovoLog sliding scale, heparin and normal saline flushes for PICC line, Roxicodone 10 mg q.4 h. p.r.n. moderate pain, lactulose 30 mL b.i.d. p.r.n. constipation, MS Contin 30 mg orally q.12 h.   HOSPITAL COURSE: The patient was admitted, 07/30/2014, will be discharged over to [facility] for transfer on 08/15/2014. Please see discharge summary on 08/09/2014 and again the addendum on 08/13/2014 for hospital course up until that point.   This is an addendum of hospital course on February 8 and February 9. The patient remained relatively stable. Last magnesium 2.2, last phosphorus 4.7, glucose 88, BUN 11, creatinine 0.84, sodium 135, potassium 4.7, chloride 99, CO2 of 29, calcium 8.9. The patient's last lipase was 717. The patient remained in a good amount of pain in the abdomen. I had to start and then increase MS Contin to try to get the patient off the IV Dilaudid, and increased the Roxicodone. When a CAT scan of the abdomen and pelvis was done on January 31, it showed a pseudocyst. GI recommended transfer to a tertiary care center for further evaluation. The patient was on the waiting list at [facility], but no beds. I called over to [facility],  and the patient was accepted to [facility] for further evaluation of the pseudocyst. We do not have a surgeon that would touch the pancreas, or a gastroenterologist that can do an endoscopic ultrasound here at this hospital.   DISPOSITION: The patient will be transferred to a tertiary care center, at this time, for further evaluation.   TIME SPENT ON DISCHARGE: 35 minutes.    ____________________________ Tana Conch. Leslye Peer, MD rjw:JT D: 08/15/2014 10:37:02 ET T: 08/15/2014 11:13:50 ET JOB#: [id number]  cc: Tana Conch. Leslye Peer, MD, <Dictator> Marisue Brooklyn MD ELECTRONICALLY SIGNED 08/17/2014 13:58

## 2014-11-05 NOTE — Consult Note (Signed)
Chief Complaint:  Subjective/Chief Complaint seen for pancreatitis with interval development of large pancreatic pseudocyst.  continues with abdominal pain, mostly upper, no nausea, no bm   VITAL SIGNS/ANCILLARY NOTES: **Vital Signs.:   03-Feb-16 15:25  Vital Signs Type Q 4hr  Temperature Temperature (F) 99.2  Celsius 37.3  Temperature Source oral  Pulse Pulse 95  Respirations Respirations 20  Systolic BP Systolic BP 409  Diastolic BP (mmHg) Diastolic BP (mmHg) 69  Mean BP 81  Pulse Ox % Pulse Ox % 94  Pulse Ox Activity Level  At rest  Oxygen Delivery Room Air/ 21 %   Brief Assessment:  Cardiac tachycardia   Respiratory clear BS   Gastrointestinal details normal mild distendion, general tenderness, mostly upper, no rebound minimal distant bowel sounds   Lab Results:  Hepatic:  03-Feb-16 05:43   Bilirubin, Total 0.5  Bilirubin, Direct 0.2 (Result(s) reported on 09 Aug 2014 at 10:48AM.)  Alkaline Phosphatase 86  SGPT (ALT)  64  SGOT (AST)  47  Total Protein, Serum 7.2  Albumin, Serum  2.1  LabUnknown:  03-Feb-16 05:43   Result Interpretation Blood smear reviewed. There is reactive thrombocytosis. RBCs are unremarkable. There is marked reactive neutrophilic leukocytosis with left shift. Blain Pais, MD.  Routine Chem:  03-Feb-16 05:43   Result Comment LABS - This specimen was collected through an   - indwelling catheter or arterial line.  - A minimum of 66ms of blood was wasted prior    - to collecting the sample.  Interpret  - results with caution.  Result(s) reported on 09 Aug 2014 at 10:44AM.  Result Comment labs - This specimen was collected through an   - indwelling catheter or arterial line.  - A minimum of 529m of blood was wasted prior    - to collecting the sample.  Interpret  - results with caution.  Result(s) reported on 09 Aug 2014 at 06:07AM.  Result Comment Differential/Myelocytes - PATHOLOGIST TO REVIEW SMEAR. COMMENTS  - APPEAR ON REPORT WHEN  COMPLETE.  Result(s) reported on 09 Aug 2014 at 06:35AM.  Lipase  1081  Phosphorus, Serum 4.6 (Result(s) reported on 09 Aug 2014 at 06:07AM.)  Magnesium, Serum 2.1 (1.8-2.4 THERAPEUTIC RANGE: 4-7 mg/dL TOXIC: > 10 mg/dL  -----------------------)  Glucose, Serum  141  BUN 9  Creatinine (comp) 0.91  Sodium, Serum 137  Potassium, Serum 3.7  Chloride, Serum 103  CO2, Serum 26  Calcium (Total), Serum 8.6  Anion Gap 8  Osmolality (calc) 275  eGFR (African American) >60  eGFR (Non-African American) >60 (eGFR values <6069min/1.73 m2 may be an indication of chronic kidney disease (CKD). Calculated eGFR, using the MRDR Study equation, is useful in  patients with stable renal function. The eGFR calculation will not be reliable in acutely ill patients when serum creatinine is changing rapidly. It is not useful in patients on dialysis. The eGFR calculation may not be applicable to patients at the low and high extremes of body sizes, pregnant women, and vegetarians.)  Routine Hem:  03-Feb-16 05:43   WBC (CBC)  29.8  RBC (CBC)  3.06  Hemoglobin (CBC)  10.2  Hematocrit (CBC)  30.3  Platelet Count (CBC)  591  MCV 99  MCH 33.5  MCHC 33.8  RDW 12.5  Bands 4  Segmented Neutrophils 73  Lymphocytes 5  Monocytes 9  Eosinophil 1  Metamyelocyte 4  Myelocyte 3  Promyelocyte 1  Diff Comment 1 ANISOCYTOSIS  Diff Comment 2 POIKILOCYTOSIS  Diff Comment 3 PLTS VARIED  IN SIZE  Diff Comment 4 LARGE PLATELETS  Diff Comment 5 TOXIC GRANULATION  Diff Comment 6 DOHLE BODIES  Result(s) reported on 09 Aug 2014 at 06:35AM.   Radiology Results: CT:    25-Jan-16 10:22, CT Abdomen and Pelvis With Contrast  CT Abdomen and Pelvis With Contrast   REASON FOR EXAM:    (1) pancreatitis; (2) pancreatitis  COMMENTS:       PROCEDURE: CT  - CT ABDOMEN / PELVIS  W  - Jul 31 2014 10:22AM     CLINICAL DATA:  Initial encounter for leukocytosis with elevated  lipase. Personal history  pancreatitis.    EXAM:  CT ABDOMEN AND PELVIS WITH CONTRAST    TECHNIQUE:  Multidetector CT imaging of the abdomen and pelvis was performed  using the standard protocol following bolus administration of  intravenous contrast.  CONTRAST:  100 cc Omnipaque 350    COMPARISON:  No comparison studies available.    FINDINGS:  Lower chest: Images of the lower chest show dependent collapse/  consolidation bilaterally with small bilateral pleural effusions.    Hepatobiliary: The liver shows diffusely decreased attenuation  suggesting steatosis. Bile within the gallbladder lumen has  increased attenuation, likely due to sludge although small stones  could also have this appearance. No intrahepatic or extrahepatic  biliary dilation.    Pancreas: Pancreas is diffusely edematous with peripancreatic edema/  inflammation. No rim enhancing or focal pancreatic or peripancreatic  fluid collection. No dilatation of the main pancreatic duct.    Spleen: No splenomegaly. No focal mass lesion.    Adrenals/Urinary Tract: No adrenal nodule or mass. Kidneys are  unremarkable. No evidence for hydroureter. Urinary bladder is normal  in appearance.    Stomach/Bowel: Stomach is nondistended. No gastric wall thickening.  No evidence of outlet obstruction. Duodenum is normally positioned  as is the ligament of Treitz. No small bowel wall thickening. No  small bowel dilatation. Terminal ileum and appendix are normal. No  gross colonic mass. No colonic wall thickening. No substantial  diverticular change.  Vascular/Lymphatic: No abdominal aortic aneurysm. The portal vein  and superior mesenteric vein are patent. Splenic vein is patent. No  gastrohepatic or hepatoduodenal ligament lymphadenopathy. No  retroperitoneal lymphadenopathy. No evidence for pelvic sidewall  lymphadenopathy.    Reproductive: Prostate gland and seminal vesicles are unremarkable.    Other: Perihepatic and perisplenic ascites is  evident. Fluid is  seeing along the stomach with edema/fluid/ inflammation evident in  the gastrocolic ligament. Retroperitoneal edema/ inflammation tracks  caudally in the extraperitoneal spaces into the pelvis. There is a  small to moderate amount of intraperitoneal free fluid in the  anatomic pelvis.  Musculoskeletal: Bone windows reveal no worrisome lytic or sclerotic  osseous lesions.     IMPRESSION:  Diffusely edematous pancreas with peripancreatic edema/  inflammation. No evidence for pancreatic necrosis. No pancreatic or  peripancreatic abscess or pseudocyst at this time. No evidence for  vascular complication.    Moderate abdominal and pelvic ascites with fairly extensive  edema/inflammation within the extraperitoneal tissues of the abdomen  and pelvis.      Electronically Signed    By: Misty Stanley M.D.    On: 07/31/2014 10:32         Verified By: ERIC A. MANSELL, M.D.,    31-Jan-16 13:49, CT Abdomen With Contrast  CT Abdomen With Contrast   REASON FOR EXAM:    pancreatitis;    NOTE: Nursing to Give Oral CT   Contrast  COMMENTS:  PROCEDURE: CT  - CT ABDOMEN STANDARD W  - Aug 06 2014  1:49PM     CLINICAL DATA:  34 year old male with abdominal pain and some  nausea. History of pancreatitis.    EXAM:  CT ABDOMEN WITH CONTRAST    TECHNIQUE:  Multidetector CT imaging of the abdomen was performed using the  standard protocol following bolus administration of intravenous  contrast.    CONTRAST:  100 mm of Omnipaque 300.    COMPARISON:CT of the abdomen and pelvis 07/31/2014.    FINDINGS:  Lower chest: Extensive areas of atelectasis in the lower lobes of  the lungs bilaterally. Small bilateral pleural effusions layering  dependently.    Hepatobiliary: Diffuse decreased attenuation throughout the hepatic  parenchyma, compatible with hepatic steatosis. No discrete cystic or  solid hepatic lesions. No intra or extrahepatic biliary ductal  dilatation.  Gallbladder is nearly completely decompressed.  Pancreas: Pancreatic parenchyma enhances normally. There continues  to be a large amount of peripancreatic fluid and inflammatory  changes, which has increased compared to prior study from  07/31/2014. This inflammatory fluid extends throughout the  retroperitoneum, but also extends anteriorly into the root of the  small bowel mesentery into the transverse mesocolon. A very large  collection of fluid is also noted underneath the fundus and body of  the stomach, which appears more organized than the other  collections, estimated to measure approximately 13.8 x 13.2 x 5.3  cm, likely a developing pseudocyst. Large amount of fluid in the  retroperitoneum on the left side displaces the descending colon  anteriorly. The inferior extent of this collection is not imaged, as  the pelvis was not included on today's examination.    Spleen: Small volume of perisplenic ascites. Otherwise,  unremarkable.    Adrenals/Urinary Tract: Bilateral kidneys and bilateral adrenal  glands are normal in appearance. No hydroureteronephrosis to the  visualized portions of the abdomen.    Stomach/Bowel: The fundus and body of the stomach are distorted by  the underlying peripancreatic fluid collection. Stomach is otherwise  unremarkable in appearance. No pathologic dilatation of small bowel  or colon. Gaseous distention of the cecum.    Vascular/Lymphatic: No significant atherosclerotic disease or  definite aneurysm identified in the abdominal vasculature. Numerous  borderline enlarged retroperitoneal lymph nodes are nonspecific, but  presumably reactive.  Other: Small volume of ascites.  No pneumoperitoneum.    Musculoskeletal: 2.4 x 1.3 cm low-attenuation lesion in the  subcutaneous fat of the lower chest near the midline likely  represents a large sebaceous cyst (unchanged). There areno  aggressive appearing lytic or blastic lesions noted in the  visualized  portions of the skeleton.     IMPRESSION:  1. Worsening pancreatic and peripancreatic inflammatory changes,  compatible with worsening pancreatitis. No signs of frank pancreatic  necrosis on today's examination. However, there is extensive  peripancreatic inflammatory exudate throughout the retroperitoneum,  small bowel mesentery and transverse mesocolon, with what appears to  be a very large developing pseudocyst along the inferior surface of  the fundus and proximal body of the stomach.  2. Small bilateral pleural effusions with extensive atelectasis in  the lower lobes of the lungs bilaterally.  3. Additional incidental findings, as above.      Electronically Signed    By: Vinnie Langton M.D.    On: 08/06/2014 15:16         Verified By: Etheleen Mayhew, M.D.,   Assessment/Plan:  Assessment/Plan:  Assessment 1) acute etoh related pancreatitis, some  improvement prior to interval development of pancreatic pseudocyst. On tpn, iv abx, npo.   Plan 1)continue current, awaiting transfer to [facility], per patient request, for further management no available at [facility].   Electronic Signatures for Addendum Section:  Loistine Simas (MD) (Signed Addendum [phone] 20:27)  Dr Vira Agar covering tomorrow.   Electronic Signatures: Loistine Simas (MD)  (Signed [phone] 20:25)  Authored: Chief Complaint, VITAL SIGNS/ANCILLARY NOTES, Brief Assessment, Lab Results, Radiology Results, Assessment/Plan   Last Updated: 03-Feb-16 20:27 by Loistine Simas (MD)

## 2014-11-05 NOTE — Consult Note (Signed)
Brief Consult Note: Diagnosis: alcoholic pancreatitis.   Patient was seen by consultant.   Consult note dictated.   Comments: Appreciate consult for 34 y/o caucasian man with history of polysubtance abuse, recent episode of alcoholic panreatitis with a component of alcoholic hepatitis, for evaluation of pancreatic pseudocyst. Patient originally admitted 07/30/14- had GI consultation at the time by Dr Tiffany Kocher, who signed off when pancreatitis began to improve- please refer to prior notes. Was noted 08/06/14 that patient's lipase began to elevate, CT found worsening pancreatitis and a large peripancreatic cyst thought to be pseudocyst measuring at least 13.2 x 13.8 x 5.3 cm- noted that this was incomplete msmt as the cyst/mass extended down into the pelvis. Patient states that he is continuing with upper abdominal pain across the torso- po intake exacerbates. Has not other specific complaints. Has been confused intermittently. Hemoccult negative recently. LFts have been improving since admission. Psych following for agitiation/dt's/intermittent aggression. Has been running low grade temp, has bilateral lower pulmonary effusions. WBC increased today. Started on Invanz and is to get TPN for nutritional support.  States his last illicit use was the 1st of january and was cocaine. Admits to drinking at least 5 40oz beer/d & often more to that. Discussed illicit & etoh effect on him, Impression and plan: 1. Large pancreatic pseudocyst. May need medical center evaluation for further eval and draining. This is likely contributing to his pain and worsening pancreatitis. will d/w Dr Gustavo Lah                                 2. Alcoholism/polysubstance abuse: states he'd like rehab, encouraged this. Rec total abstinence.                                 3. Elevated AST: likely etoh, rec as above.  Electronic Signatures: Stephens November H (NP)  (Signed 02-Feb-16 15:45)  Authored: Brief Consult Note   Last  Updated: 02-Feb-16 15:45 by Theodore Demark (NP)

## 2014-11-05 NOTE — Consult Note (Signed)
PATIENT NAME:  Brandon Oneill, Brandon Oneill MR#:  [id number] DATE OF BIRTH:  01/26/1981  DATE OF CONSULTATION:  08/08/2014  REFERRING PHYSICIAN:   CONSULTING PHYSICIAN:  Theodore Demark, NP  HISTORY OF PRESENT ILLNESS: I appreciate consult for 34 year old Caucasian man with history of polysubstance abuse, recent episode of alcoholic pancreatitis with component of alcoholic hepatitis for evaluation of pancreatic pseudocysts. The patient originally admitted 07/30/2014, had GI consultation at the time with Dr. Vira Agar who signed off when the pancreatitis began to improve. Please refer to prior notes. Was noted 08/06/2014, that the patient's lipase began to elevate. CT found worsening pancreatitis and a large peripancreatic cyst thought to be pseudocyst measuring at least 13.2 x 13.8 x 5.3 cm. Noted that this was incomplete measurement, as the cyst and mass extended down into the pelvis. It also was causing pressure on the small intestine and some other abdominal organs. The patient states that he is continuing with upper abdominal pain across the torso, p.o. intake exacerbates. Has been unable intake p.o. and there are plans to start TPN soon. Has not had other specific complaints, has been confused intermittently. Hemoccult negative recently. LFTs have been improving since admission. Psych following for agitation/DT/intermittent aggression, has been running a low-grade temperature with bilateral lower pulmonary effusions. White count increased today. Started on Invanz and is to get TPN for nutritional support. States his last state of illicit use was the first of January when he used cocaine. Admits to drinking at least five 40-ounce beers a day and often more than that.   PAST MEDICAL HISTORY: One prior episode of previous pancreatitis aside from this admission, depression, alcohol abuse, illicit abuse, including IVDU, intranasal cocaine, and other substances, tobacco abuse.   PAST SURGICAL HISTORY: None.    ALLERGIES: ERYTHROMYCIN.   MEDICATIONS: Ibuprofen p.r.n.   SOCIAL HISTORY: Significant for current alcohol and other substances. Currently smokes 1/2 pack a day, unemployed at present.   FAMILY HISTORY: Father with alcoholic cirrhosis, mother with Parkinson's.  REVIEW OF SYSTEMS: Ten systems reviewed. Significant for intermittent confusion, otherwise really unremarkable and some aggression as noted above; otherwise, unremarkable.   LABORATORY DATA: Most recent labs: Glucose 146, BUN 7, creatinine 0.78, sodium 138, potassium 3.6, chloride 103, GFR greater than 60, calcium 8.6, phosphorus 3.1, magnesium 2.1, triglycerides 121, total protein 6.9, albumin 2.1, ALP 99, AST 48. WBC 26.4, hemoglobin 10.3, platelets 484,000. PT 16.1, INR 1.3 Hemoccult negative. Pre-albumin 6.  CT done January 31 with hepatic steatosis, no intrahepatic or extrahepatic biliary ductal dilation. Large amount of peripancreatic fluid and inflammatory changes, which is increased compared to 07/31/2014. Inflammatory fluid extends throughout the retroperitoneum. Large collection of fluid underneath the fundus and body of stomach, which appears more organized than other collections, estimated approximately 13.8 x 13.2 x 5.3 cm likely developing pseudocyst. Large amount of fluid in the retroperitoneum on the left side displaces the descending colon anteriorly. Fundus and body of stomach are distorted by underlying peripancreatic fluid collection, otherwise unremarkable. No pathologic dilation of the small bowel, small bilateral pleural effusions with atelectasis.   PHYSICAL EXAMINATION: VITAL SIGNS: Most recent: Temperature 98.5, pulse 97, respiratory rate 20, blood pressure 113/75, Sao2 96% on room air.  GENERAL: Well-appearing young man in no acute distress.  HEENT: Normocephalic, atraumatic. Mucous membranes pink and moist. Sclerae clear. Conjunctivae pink.  NECK: Supple. No JVD, lymphadenopathy, thyromegaly.  CHEST:  Respirations eupneic. Lungs clear.  CARDIAC: S1, S2, RRR. No MRG. No appreciable edema.  ABDOMEN: Flat hypoactive bowel sounds x4,  some tenderness across the upper torso. No guarding, rigidity, rebound, tenderness, peritoneal signs, hepatosplenomegaly, or other abnormalities. No colon or [place] signs.  SKIN: Warm, dry, pink. No erythema, lesion, or rash.  NEUROLOGICAL: Alert, oriented x2. Cranial nerves intact. Speech clear. No facial droop.  PSYCHIATRIC: Some limited insight, cooperative, pleasant.   IMPRESSION AND PLAN: 1.  Large pancreatic pseudocyst, worsening pancreatitis. May need [facility] evaluation for further evaluation and draining. This is likely contributing to his pain and worsening his pancreatitis. We will discuss with Dr. Gustavo Lah. 2.  Alcoholism and polysubstance abuse. States he would like rehab. I encouraged this. Recommend total abstinence. 3.  Elevated AST, likely alcohol related. Recommendations as above.  These services were provided by Stephens November, MSN, [facility], in collaboration with Lollie Sails, MD, with whom I have discussed this patient in full. Thank you very much for this consult.   ____________________________ Theodore Demark, NP chl:sw D: 08/09/2014 10:23:42 ET T: 08/09/2014 10:39:36 ET JOB#: [id number]  cc: Theodore Demark, NP, <Dictator> [place] SIGNED 08/21/2014 15:26

## 2014-11-05 NOTE — Consult Note (Signed)
Psychiatry: PAtient seen. Chart reviewed. Awake and much more lucid. A&O. Not agitated or hostile. Vitals stable. Still some abdominal pain but less so and able to take some clear liquids. No change to treatment rec.  Electronic Signatures: Gonzella Lex (MD)  (Signed on 01-Feb-16 23:10)  Authored  Last Updated: 01-Feb-16 23:10 by Gonzella Lex (MD)

## 2014-11-05 NOTE — Consult Note (Signed)
Pt with severe pancreatitis from alcoholism, potomania of beer.  He was moved to the CCU for closer nursing contact due to his impending DT's and severity of his pancreatitis.  He is at high risk for complicated disease with ATN, resp failure.  See dictated note.  Recommend MVI if possible, (hospital shortage) check Magnesium next blood draw.  Follow LFT's.  Prognosis guarded.  Electronic Signatures: Manya Silvas (MD)  (Signed on 25-Jan-16 16:53)  Authored  Last Updated: 25-Jan-16 16:53 by Manya Silvas (MD)

## 2014-11-05 NOTE — H&P (Signed)
PATIENT NAME:  Brandon Oneill, Brandon Oneill MR#:  [id number] DATE OF BIRTH:  06-04-1981  DATE OF ADMISSION:  07/30/2014  PRIMARY CARE PHYSICIAN: Guadalupe Maple, MD   CHIEF COMPLAINT: Abdominal pain.   HISTORY OF PRESENT ILLNESS: This is a 34 year old man who has been drinking alcohol for the last 10 years, five 40 ounces drinks per day of beer. He is coming in with a pancreatic attack, his liver hurts, feels miserable. Pain described 8/10 in intensity, cramping constant pain, sitting hunched over makes it feel a little bit better. He sweated and had some left arm and shoulder numbness. Previously, he did have pain in his abdomen for months at a time. Yesterday, he said he had some dark blood in the bowel movements. This morning he vomited x 2, no hematemesis. In the ER, he was found to have a lipase of 3550. Hospitalist services were contacted for further evaluation.   PAST MEDICAL HISTORY: History of pancreatitis, did have history of depression with a prior psychiatric admission and then also alcohol abuse, tobacco abuse.   PAST SURGICAL HISTORY: None.   ALLERGIES: ERYTHROMYCIN.   MEDICATIONS: As outpatient none.   SOCIAL HISTORY: He drinks five 40 ounce beers per day for the past 10 years. Smokes 1/2 pack per day. No drug use. Currently not working, was a previous Astronomer at [employer].   FAMILY HISTORY: Father with alcoholic cirrhosis. Mother with Parkinson disease.   REVIEW OF SYSTEMS: CONSTITUTIONAL: Positive for sweats. No fever or chills. Positive for weight loss. Positive or fatigue.  EYES: He does wear glasses.  EARS, NOSE, MOUTH AND THROAT: Positive for runny nose. Positive for postnasal drip.  CARDIOVASCULAR: Positive for chest pain. No palpitations.  RESPIRATORY: Positive for shortness of breath. No cough. No sputum. No hemoptysis.  GASTROINTESTINAL: Positive for nausea. Positive for vomiting. No hematemesis. Positive for abdominal pain. Positive for dark red blood per rectum  yesterday.  GENITOURINARY: No burning on urination. No hematuria.  MUSCULOSKELETAL: No joint pain or muscle pain.  INTEGUMENT: No rashes or eruptions.  NEUROLOGIC: One seizure in the past with withdrawal of alcohol.  PSYCHIATRIC: Positive for anxiety, depression, thoughts of hurting other people.  NEUROLOGIC: No fainting or blackouts.  ENDOCRINE: No thyroid problems.  HEMATOLOGIC AND LYMPHATIC: No anemia.   PHYSICAL EXAMINATION:  VITAL SIGNS: On presentation to the ER included a temperature of 97.7, pulse 84, respirations 18, blood pressure 126/73, pulse oximetry 98% on room air. Blood pressure did go up when I was in the room, 180/100.  GENERAL: No respiratory distress.  EYES: Conjunctivae and lids normal. Pupils equal, round and reactive to light. Extraocular muscles intact. No nystagmus.  EARS, NOSE, MOUTH AND THROAT: Tympanic membranes: No erythema. Nasal mucosa: No erythema. Throat: No erythema. No exudate seen. Lips and gums: No lesions.  NECK: No JVD. No bruits. No lymphadenopathy. No thyromegaly. No thyroid nodules palpated.  LUNGS: Clear to auscultation. No use of accessory muscles to breathe. No rhonchi, rales or wheeze heard.  CARDIOVASCULAR: S1, S2 normal. No gallops, rubs, or murmurs heard. Carotid upstroke 2+ bilaterally. No bruits. Dorsalis pedis pulses 2+ bilaterally. No edema of the lower extremity.  ABDOMEN: Soft. Positive tenderness in the epigastric area. No organomegaly/splenomegaly. Normoactive bowel sounds. No masses felt.  LYMPHATIC: No lymph nodes in the neck.  MUSCULOSKELETAL: No clubbing, edema. No cyanosis.  SKIN: No ulcers or lesions seen.  NEUROLOGIC: Cranial nerves II-XII grossly intact. Deep tendon reflexes 2+ bilateral lower extremities.  PSYCHIATRIC: The patient is oriented to person,  place and time.   LABORATORY AND RADIOLOGICAL DATA: White blood cell count 9.9, H and H 15.2 and 45.1, platelet count of 189,000, lipase 3550, glucose 137, BUN 11, creatinine  0.93, sodium 136, potassium 4.0, chloride 103, CO2 of 23, calcium 8.2. Liver function tests: ALT up at 68, AST up at 95, total protein 7.5, albumin low at 3.0. EKG normal sinus rhythm, no acute ST-T wave changes.   ASSESSMENT AND PLAN:  1.  Acute alcohol pancreatitis. We will keep n.p.o., give IV fluid hydration 3 liters wide open and 125 mL/h, p.r.n. pain medications and nausea medications IV and oral.  2.  Alcohol abuse. We will put on CIWA protocol. The patient has had withdrawal seizures in the past, high risk for withdrawal.  3.  Elevated liver function tests, likely secondary to alcohol abuse. Continue to monitor.  4.  Tobacco abuse. Refused a nicotine patch, smoking cessation counseling done by me, 3 minutes in counseling.  5.  The patient states that he had dark blood rectally yesterday. We will start Protonix orally just in case this is a gastrointestinal bleed. We will guaiac stools. Get serial hemoglobins. If any further bleeding, will need octreotide drip.  6.  Thoughts of hurting other people with anxiety and depression. We will get a psychiatric consultation and put on suicide precautions for right now.  7.  Accelerated hypertension. I believe this is secondary to severe pain. We will control pain and continue to monitor.  TIME SPENT ON ADMISSION: Fifty-five minutes.    ____________________________ Tana Conch. Leslye Peer, MD rjw:TT D: 07/30/2014 14:11:20 ET T: 07/30/2014 14:54:04 ET JOB#: [id number]  cc: Tana Conch. Leslye Peer, MD, <Dictator> Guadalupe Maple, MD Marisue Brooklyn MD ELECTRONICALLY SIGNED 08/04/2014 14:44

## 2014-11-05 NOTE — Consult Note (Signed)
psychiatry: Patient seen and chart reviewed. PAtient is awaiting bed availibility for transfer to [facility]. Complains of pain but not confusion or hallucinations. Does not appear as delirius. Through DTs. Supportive counceling and esp review of the life or death nature of drinking problem. No change to treatment plan.  Electronic Signatures: Gonzella Lex (MD)  (Signed on 04-Feb-16 22:45)  Authored  Last Updated: 04-Feb-16 22:45 by Gonzella Lex (MD)

## 2014-11-05 NOTE — Consult Note (Signed)
Chief Complaint:  Subjective/Chief Complaint Patient seen and examined, chart reviewed. Please see full GI consult.  Patient with h/o etoh related pancreatitis admitted last week with pancreatitis.  Intiial improvement, then recurrent pain/n/v in the past 3 days.  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 Patietn with increasing abdominal apin, nausea, wbc and slight recurance of elevate4d lipase since several days ago.  The cyst is crowding the upper stomach.   With the above chabnges, patient may need cyst drainage, endoscopically or by EUS/ERCP which is not done at [facility].  REcommend continuing abx, arrange for transfer to [facility] (patient request).  Discussed wth Dr Serita Grit.   VITAL SIGNS/ANCILLARY NOTES: **Vital Signs.:   02-Feb-16 19:13  Vital Signs Type Routine  Celsius 37.3  Temperature Source oral  Pulse Pulse 104  Respirations Respirations 18  Systolic BP Systolic BP 169  Diastolic BP (mmHg) Diastolic BP (mmHg) 69  Mean BP 82  Pulse Ox % Pulse Ox % 95  Pulse Ox Activity Level  At rest  Oxygen Delivery Room Air/ 21 %   Electronic Signatures: Loistine Simas (MD)  (Signed 02-Feb-16 20:29)  Authored: Chief Complaint, VITAL SIGNS/ANCILLARY NOTES   Last Updated: 02-Feb-16 20:29 by Loistine Simas (MD)

## 2014-11-05 NOTE — Consult Note (Signed)
Psychiatry: PAtient seen and discussed with nursing . Chart reviewed. Patient continues to be confused and delirious though gradually calmer. Starting to seem a little unclear whether he willfully recover or could have permanent damage. Continue to treat for DTs. Will continue to follow.  Electronic Signatures: Madisynn Plair, Madie Reno (MD)  (Signed on 02-Feb-16 21:44)  Authored  Last Updated: 02-Feb-16 21:44 by Gonzella Lex (MD)

## 2014-11-05 NOTE — Consult Note (Signed)
Chief Complaint:  Subjective/Chief Complaint continues with abdominalpain, however is passing flatus,   VITAL SIGNS/ANCILLARY NOTES: **Vital Signs.:   05-Feb-16 09:22  Vital Signs Type Pre Medication  Temperature Temperature (F) 99  Celsius 37.2  Pulse Pulse 87  Respirations Respirations 18  Systolic BP Systolic BP 253  Diastolic BP (mmHg) Diastolic BP (mmHg) 71  Mean BP 84  Pulse Ox % Pulse Ox % 96  Pulse Ox Activity Level  At rest  Oxygen Delivery Room Air/ 21 %   Brief Assessment:  Cardiac Regular  tachycardia   Respiratory clear BS   Gastrointestinal details normal Bowel sounds normal  No rebound tenderness  mild distension upper abdomen, bs positive, tender throughout abdomen, mostly upper.   Lab Results: Routine Chem:  05-Feb-16 05:51   Glucose, Serum  119  BUN 11  Creatinine (comp) 0.80  Sodium, Serum 136  Potassium, Serum 4.0  Chloride, Serum 102  CO2, Serum 27  Calcium (Total), Serum  8.3  Anion Gap 7  Osmolality (calc) 272  eGFR (African American) >60  eGFR (Non-African American) >60 (eGFR values <32m/min/1.73 m2 may be an indication of chronic kidney disease (CKD). Calculated eGFR, using the MRDR Study equation, is useful in  patients with stable renal function. The eGFR calculation will not be reliable in acutely ill patients when serum creatinine is changing rapidly. It is not useful in patients on dialysis. The eGFR calculation may not be applicable to patients at the low and high extremes of body sizes, pregnant women, and vegetarians.)  Result Comment labs - This specimen was collected through an   - indwelling catheter or arterial line.  - A minimum of 547m of blood was wasted prior    - to collecting the sample.  Interpret  - results with caution.  Result(s) reported on 11 Aug 2014 at 06:26AM.  Magnesium, Serum 2.3 (1.8-2.4 THERAPEUTIC RANGE: 4-7 mg/dL TOXIC: > 10 mg/dL  -----------------------)  Phosphorus, Serum 3.4 (Result(s) reported  on 11 Aug 2014 at 06:26AM.)   Assessment/Plan:  Assessment/Plan:  Assessment 1) etoh related pancreatitis, with development of olarge pancreatic pseudocyst.  hemodynamically stable, currently with passing flatus and good bowel sounde.  [facility]  continue abx, on tpn.  Dr ReRayann Hemanvailable over the weekend if needed.   Electronic Signatures: SkLoistine SimasMD)  (Signed [id number])  Authored: Chief Complaint, VITAL SIGNS/ANCILLARY NOTES, Brief Assessment, Lab Results, Assessment/Plan   Last Updated: 05-Feb-16 16:28 by SkLoistine SimasMD)

## 2014-11-05 NOTE — Consult Note (Signed)
I will sign off, reconsult if I can be of service.  Electronic Signatures: Manya Silvas (MD)  (Signed on 27-Jan-16 18:32)  Authored  Last Updated: 27-Jan-16 18:32 by Manya Silvas (MD)

## 2014-11-05 NOTE — Consult Note (Signed)
PATIENT NAME:  Brandon Oneill, Brandon Oneill MR#:  [id number] DATE OF BIRTH:  1980-08-09  DATE OF CONSULTATION:  07/30/2014  CONSULTING PHYSICIAN:  Gonzella Lex, MD  IDENTIFYING INFORMATION AND REASON FOR CONSULTATION: This is a 34 year old man with a history of alcohol abuse, who presented to the hospital with symptoms of acute pancreatitis. Consultation for alleged thoughts of hurting others.   HISTORY OF PRESENT ILLNESS: Information obtained from the patient and the chart. The patient presented to the emergency room with acute abdominal pain, nausea, inability to keep food down. Admitted to the medical service. The patient reports that he has been drinking heavily about 4 or 5 of the 40 ounces bottles of beer daily. Always at least 3 of them. Additionally, he reports that his mood has been down and bad for a while. Things got worse recently when he was let go from his job. He had been working at [employer] for a long time, but when the ownership changed hands, he was let go from the job. He has had some thoughts about wanting to hurt people at his previous job, but has not carried through on them. Has had passive suicidal thoughts, but has also no plan and has not carried through on them. Sleep has been very poor. Mood is depressed. Feeling somewhat hopeless. He is not reporting auditory or visual hallucinations. He has not been taking antidepressant medicine or getting any outpatient treatment for an unknown period of time. He is not able to give a very clear history about that at this point. Denies that he has been abusing other drugs.   PAST PSYCHIATRIC HISTORY: Prior psychiatric admission in May 2014 for alcohol detoxification and depression. He was started on citalopram at the time and states that he did think that it was helpful. Says that he stayed on it for several months. He was referred at that time for outpatient treatment at [facility]. Said that he went for treatment for a while, but after some period of  time felt like it was not helpful anymore. He denies that he has had any suicide attempts. No history of psychotic disorder or mania.   PAST MEDICAL HISTORY: The patient has recurrent episodes of pancreatitis related to his drinking. He is not currently taking any medication or getting any outpatient treatment for that.   SOCIAL HISTORY: Lives by himself, but for the last week has been staying with his mother. Initially, he had indicated to nursing that he did not want his mother involved in his treatment, but he told me that he had changed his mind and wanted her involved in the history and treatment. He is not currently working. Has no insurance.   FAMILY HISTORY: Father with alcohol dependence with cirrhosis.   SUBSTANCE ABUSE HISTORY: Heavy drinking. Has been able to stay sober for several months at a time. Has not been abusing any other drugs. No history of DTs or seizures.   REVIEW OF SYSTEMS: Abdominal pain is pretty bad right now. A little bit sick to his stomach. When I saw him, he was fairly sedated having recently been given some Ativan and Dilaudid. Mood has been depressed. Passive suicidal and homicidal ideation, but no intent or plan and has good insight about that. No intent on acting out on it and understands the consequences.   MENTAL STATUS EXAMINATION: Neatly groomed gentleman who looks his stated age or older, interviewed in a hospital room. He had just received sedating medication and did his best to cooperate, but  he still was only able to stay awake for brief periods of time. Eye contact minimal. Psychomotor activity minimal. Speech decreased in total amount and somewhat quiet. Affect flat. Mood stated as not so good. Thoughts are slow but lucid. No obvious delusions. Denies auditory or visual hallucinations. Denies acute suicidal or homicidal intent or plan. Alert and oriented x 4. Could repeat 3 words immediately, but could not remember any of them at 3 minutes. Judgment and  insight adequate. Intelligence normal.   LABORATORY RESULTS: Chemistry panel shows an elevated glucose 137, calcium low 8.2, AST elevated at 95, ALT elevated at 68, low albumin at 3. Lipase 3550. CBC unremarkable except for a slightly high MCV.   VITAL SIGNS: Blood pressure currently 148/91, respirations 18, pulse 91, temperature 98.1.   ASSESSMENT: A 34 year old man with alcohol abuse and a history of depression came into the hospital for pancreatitis. He is not psychotic and has good insight into his needs. He indicates that he had been planning to go to [facility] to request detoxification, but then could not stand the pain anymore and came into our hospital instead. Although he had made some passive suicidal and homicidal statements, it is clear that he is not intending to act out on any of this. He is currently lucid in his thinking. No history of violence in the past. I do not think he requires a sitter or suicide cautions at this point.   TREATMENT PLAN: Continue with detox protocol is currently written as well as treatment for pancreatitis. I am going to defer restarting his antidepressant until his pancreatitis is under better control because I do not want to worsen his nausea or give him oral medicine that he might just throw up. Continue supportive therapy and educational counseling. We have discussed rehab treatment. Without any insurance coverage at all, his options will probably be limited to the [facility]. I can request social work to initiate a referral tomorrow.   DIAGNOSIS, PRINCIPAL AND PRIMARY:  AXIS I: Depression, not otherwise specified.   SECONDARY DIAGNOSES: AXIS I:  1. Alcohol abuse.  2. Delirium secondary to alcohol withdrawal.   AXIS II: Deferred.   AXIS III:  1. Alcohol withdrawal.  2. Pancreatitis.    ____________________________ Gonzella Lex, MD jtc:TT D: 07/30/2014 17:25:43 ET T: 07/30/2014 17:54:08  ET JOB#: [id number]  cc: Gonzella Lex, MD, <Dictator> Gonzella Lex MD ELECTRONICALLY SIGNED 08/16/2014 17:26

## 2014-11-05 NOTE — Discharge Summary (Signed)
PATIENT NAME:  Brandon Oneill, Brandon Oneill MR#:  [id number] DATE OF BIRTH:  01-May-1981  DATE OF ADMISSION:  07/30/2014 DATE OF DISCHARGE:    ADDENDUM:  FINAL DIAGNOSES:  1.  Acute severe alcoholic pancreatitis, now with pseudocyst formation and fever, could be infected pseudocyst.  2.  Delirium tremens which resolved depression. 3.  Accelerated hypertension.   CURRENT MEDICATIONS: Include TPN at 100 mL/h, Zofran 4 mg IV push q. 4 hours, calcium carbonate 100 mg q.i.d., Dilaudid 2 mg IV q. 3 hours p.r.n. severe pain, Protonix 40 mg IV push q. 12 hours, magnesium oxide 400 mg q. 8, metoprolol 50 mg daily, Nicotrol inhaler q. 1 hour p.r.n., heparin flush for line patency, Lovenox 40 mg subcutaneous injection q. 24 hours, ertapenem 1 gram IV q. 24 hours, NovoLog sliding scale, morphine sulfate-Contin 15 mg q. 12 hours, oxycodone 10 mg q. 4 hours p.r.n. pain.   HOSPITAL COURSE: The patient was admitted 07/30/2014. The patient had a discharge summary dictated on 08/09/2014 after acceptance to [facility], not much change has happened since then. Latest laboratory data included a glucose of 153, BUN 9, creatinine 0.87, sodium 134, potassium 4.4, chloride 98, CO2 of 27, calcium 8.7, magnesium 2.2, phosphorus 4.4. Last lipase was still elevated at 850. The patient still on the waiting list to go over to [facility]. The patient did spike a temperature on February 6 at 4:18 p.m. of 100.8. Blood cultures were sent off again. The patient still remains in quite a bit of pain. Long-acting pain medication morphine sulfate-Contin was started to try to control the patient's pain better. The patient will be transferred over to the [facility] for consideration for draining this pseudocyst, which possibly could be infected with the patient's fever. The patient has been on antibiotics during the hospital course. The patient's liver function tests had improved on February 6; ALT 47, AST 38, alkaline phosphatase 73, and total bilirubin 0.4.  Hemoglobin on the lower side at 9.5.   Please see discharge summary dictated on 08/09/2014 for hospital course up until that point.    ____________________________ Tana Conch. Leslye Peer, MD rjw:TM D: 08/13/2014 14:11:00 ET T: 08/13/2014 14:54:04 ET JOB#: [id number]  cc: Tana Conch. Leslye Peer, MD, <Dictator> Marisue Brooklyn MD ELECTRONICALLY SIGNED 08/17/2014 13:58

## 2014-11-05 NOTE — Consult Note (Signed)
psychiatry: Significantly more clear and lucid today. Still having a lot of pain and apparantly the plan is for transfer to [facility] for treatment of pancreatic cyst. Affect reactive and anxious bur appropriate. Thoughts lucid. Less confused. Continue prn ativan but looks like he is prob. through the worst DTs  Electronic Signatures: Kelsen Celona, Madie Reno (MD)  (Signed on 03-Feb-16 23:22)  Authored  Last Updated: 03-Feb-16 23:22 by Gonzella Lex (MD)

## 2015-03-21 ENCOUNTER — Encounter: Payer: Self-pay | Admitting: Family Medicine

## 2015-03-21 ENCOUNTER — Ambulatory Visit (INDEPENDENT_AMBULATORY_CARE_PROVIDER_SITE_OTHER): Payer: Self-pay | Admitting: Family Medicine

## 2015-03-21 VITALS — BP 111/72 | HR 75 | Temp 97.2°F | Ht 71.3 in | Wt 240.0 lb

## 2015-03-21 DIAGNOSIS — F32A Depression, unspecified: Secondary | ICD-10-CM | POA: Insufficient documentation

## 2015-03-21 DIAGNOSIS — F329 Major depressive disorder, single episode, unspecified: Secondary | ICD-10-CM

## 2015-03-21 DIAGNOSIS — F1010 Alcohol abuse, uncomplicated: Secondary | ICD-10-CM

## 2015-03-21 DIAGNOSIS — D649 Anemia, unspecified: Secondary | ICD-10-CM

## 2015-03-21 MED ORDER — CITALOPRAM HYDROBROMIDE 20 MG PO TABS: 20.0000 mg | ORAL_TABLET | Freq: Every day | ORAL | Status: AC

## 2015-03-21 NOTE — Patient Instructions (Signed)
Alcohol and Nutrition Nutrition serves two purposes. It provides energy. It also maintains body structure and function. Food supplies energy. It also provides the building blocks needed to replace worn or damaged cells. Alcoholics often eat poorly. This limits their supply of essential nutrients. This affects energy supply and structure maintenance. Alcohol also affects the body's nutrients in:  Digestion.  Storage.  Using and getting rid of waste products. IMPAIRMENT OF NUTRIENT DIGESTION AND UTILIZATION   Once ingested, food must be broken down into small components (digested). Then it is available for energy. It helps maintain body structure and function. Digestion begins in the mouth. It continues in the stomach and intestines, with help from the pancreas. The nutrients from digested food are absorbed from the intestines into the blood. Then they are carried to the liver. The liver prepares nutrients for:  Immediate use.  Storage and future use.  Alcohol inhibits the breakdown of nutrients into usable molecules.  It decreases secretion of digestive enzymes from the pancreas.  Alcohol impairs nutrient absorption by damaging the cells lining the stomach and intestines.  It also interferes with moving some nutrients into the blood.  In addition, nutritional deficiencies themselves may lead to further absorption problems.  For example, folate deficiency changes the cells that line the small intestine. This impairs how water is absorbed. It also affects absorbed nutrients. These include glucose, sodium, and additional folate.  Even if nutrients are digested and absorbed, alcohol can prevent them from being fully used. It changes their transport, storage, and excretion. Impaired utilization of nutrients by alcoholics is indicated by:  Decreased liver stores of vitamins, such as vitamin A.  Increased excretion of nutrients such as fat. ALCOHOL AND ENERGY SUPPLY   Three basic  nutritional components found in food are:  Carbohydrates.  Proteins.  Fats.  These are used as energy. Some alcoholics take in as much as 50% of their total daily calories from alcohol. They often neglect important foods.  Even when enough food is eaten, alcohol can impair the ways the body controls blood sugar (glucose) levels. It may either increase or decrease blood sugar.  In non-diabetic alcoholics, increased blood sugar (hyperglycemia) is caused by poor insulin secretion. It is usually temporary.  Decreased blood sugar (hypoglycemia) can cause serious injury even if this condition is short-lived. Low blood sugar can happen when a fasting or malnourished person drinks alcohol. When there is no food to supply energy, stored sugar is used up. The products of alcohol inhibit forming glucose from other compounds such as amino acids. As a result, alcohol causes the brain and other body tissue to lack glucose. It is needed for energy and function.  Alcohol is an energy source. But how the body processes and uses the energy from alcohol is complex. Also, when alcohol is substituted for carbohydrates, subjects tend to lose weight. This indicates that they get less energy from alcohol than from food. ALCOHOL - MAINTAINING CELL STRUCTURE AND FUNCTION  Structure Cells are made mostly of protein. So an adequate protein diet is important for maintaining cell structure. This is especially true if cells are being damaged. Research indicates that alcohol affects protein nutrition by causing impaired:  Digestion of proteins to amino acids.  Processing of amino acids by the small intestine and liver.  Synthesis of proteins from amino acids.  Protein secretion by the liver. Function Nutrients are essential for the body to function well. They provide the tools that the body needs to work well:  Proteins.  Vitamins.  Minerals. Alcohol can disrupt body function. It may cause nutrient  deficiencies. And it may interfere with the way nutrients are processed. Vitamins  Vitamins are essential to maintain growth and normal metabolism. They regulate many of the body`s processes. Chronic heavy drinking causes deficiencies in many vitamins. This is caused by eating less. And, in some cases, vitamins may be poorly absorbed. For example, alcohol inhibits fat absorption. It impairs how the vitamins A, E, and D are normally absorbed along with dietary fats. Not enough vitamin A may cause night blindness. Not enough vitamin D may cause softening of the bones.  Some alcoholics lack vitamins A, C, D, E, K, and the B vitamins. These are all involved in wound healing and cell maintenance. In particular, because vitamin K is necessary for blood clotting, lacking that vitamin can cause delayed clotting. The result is excess bleeding. Lacking other vitamins involved in brain function may cause severe neurological damage. Minerals Deficiencies of minerals such as calcium, magnesium, iron, and zinc are common in alcoholics. The alcohol itself does not seem to affect how these minerals are absorbed. Rather, they seem to occur secondary to other alcohol-related problems, such as:  Less calcium absorbed.  Not enough magnesium.  More urinary excretion.  Vomiting.  Diarrhea.  Not enough iron due to gastrointestinal bleeding.  Not enough zinc or losses related to other nutrient deficiencies.  Mineral deficiencies can cause a variety of medical consequences. These range from calcium-related bone disease to zinc-related night blindness and skin lesions. ALCOHOL, MALNUTRITION, AND MEDICAL COMPLICATIONS  Liver Disease   Alcoholic liver damage is caused primarily by alcohol itself. But poor nutrition may increase the risk of alcohol-related liver damage. For example, nutrients normally found in the liver are known to be affected by drinking alcohol. These include carotenoids, which are the major  sources of vitamin A, and vitamin E compounds. Decreases in such nutrients may play some role in alcohol-related liver damage. Pancreatitis  Research suggests that malnutrition may increase the risk of developing alcoholic pancreatitis. Research suggests that a diet lacking in protein may increase alcohol's damaging effect on the pancreas. Brain  Nutritional deficiencies may have severe effects on brain function. These may be permanent. Specifically, thiamine deficiencies are often seen in alcoholics. They can cause severe neurological problems. These include:  Impaired movement.  Memory loss seen in Wernicke-Korsakoff syndrome. Pregnancy  Alcohol has toxic effects on fetal development. It causes alcohol-related birth defects. They include fetal alcohol syndrome. Alcohol itself is toxic to the fetus. Also, the nutritional deficiency can affect how the fetus develops. That may compound the risk of developmental damage.  Nutritional needs during pregnancy are 10% to 30% greater than normal. Food intake can increase by as much as 140% to cover the needs of both mother and fetus. An alcoholic mother`s nutritional problems may adversely affect the nutrition of the fetus. And alcohol itself can also restrict nutrition flow to the fetus. NUTRITIONAL STATUS OF ALCOHOLICS  Techniques for assessing nutritional status include:  Taking body measurements to estimate fat reserves. They include:  Weight.  Height.  Mass.  Skin fold thickness.  Performing blood analysis to provide measurements of circulating:  Proteins.  Vitamins.  Minerals.  These techniques tend to be imprecise. For many nutrients, there is no clear "cut-off" point that would allow an accurate definition of deficiency. So assessing the nutritional status of alcoholics is limited by these techniques. Dietary status may provide information about the risk of developing nutritional problems.  Dietary status is assessed by: °¨ Taking  patients' dietary histories. °¨ Evaluating the amount and types of food they are eating. °· It is difficult to determine what exact amount of alcohol begins to have damaging effects on nutrition. In general, moderate drinkers have 2 drinks or less per day. They seem to be at little risk for nutritional problems. Various medical disorders begin to appear at greater levels. °· Research indicates that the majority of even the heaviest drinkers have few obvious nutritional deficiencies. Many alcoholics who are hospitalized for medical complications of their disease do have severe malnutrition. Alcoholics tend to eat poorly. Often they eat less than the amounts of food necessary to provide enough: °¨ Carbohydrates. °¨ Protein. °¨ Fat. °¨ Vitamins A and C. °¨ B vitamins. °¨ Minerals like calcium and iron. °Of major concern is alcohol's effect on digesting food and use of nutrients. It may shift a mildly malnourished person toward severe malnutrition. °Document Released: 04/17/2005 Document Revised: 09/15/2011 Document Reviewed: 10/01/2005 °[employer]® Patient Information ©2015 [employer]. This information is not intended to replace advice given to you by your health care provider. Make sure you discuss any questions you have with your health care provider. ° °

## 2015-03-21 NOTE — Assessment & Plan Note (Signed)
Refused CBC today. Advised him that when he left the hospital he was still quite low and that would make it hard for him to feel normal- will check labs at his physical.

## 2015-03-21 NOTE — Progress Notes (Signed)
BP 111/72 mmHg  Pulse 75  Temp(Src) 97.2 F (36.2 C)  Ht 5' 11.3" (1.811 m)  Wt 240 lb (108.863 kg)  BMI 33.19 kg/m2  SpO2 98%   Subjective:    Patient ID: Brandon Oneill, male    DOB: 04/03/1981, 34 y.o.   MRN: [id number]  HPI: Brandon Oneill is a 34 y.o. male who presents today to establish care.   Chief Complaint  Patient presents with  . Depression    patient needs a refill on celexa 20mg , he has been out of his medication for about 1.5weeks   Quit drinking January 15th. No meetings, no medication. Does not want any. Lots of cravings, dreams about the alcohol. Feels like the celexa helps, but exercise helps just as much. Would like to get back to the gym once he has transportation. Has been gaining weight because they eat a lot of sweets where he is staying.   DEPRESSION- has been off of it for about a week and half Mood status: exacerbated Satisfied with current treatment?: yes Symptom severity: mild  Duration of current treatment : months Side effects: no Medication compliance: excellent compliance Psychotherapy/counseling: no  Previous psychiatric medications: none Depressed mood: yes Anxious mood: yes Anhedonia: no Significant weight loss or gain: no Insomnia: no  Fatigue: no Feelings of worthlessness or guilt: no Impaired concentration/indecisiveness: no Suicidal ideations: no Hopelessness: no Crying spells: no Depression screen PHQ 2/9 03/21/2015  Decreased Interest 1  Down, Depressed, Hopeless 1  PHQ - 2 Score 2  Altered sleeping 0  Tired, decreased energy 0  Change in appetite 1  Feeling bad or failure about yourself  1  Trouble concentrating 0  Moving slowly or fidgety/restless 0  Suicidal thoughts 0  PHQ-9 Score 4  Difficult doing work/chores Somewhat difficult   GAD7: 7   Relevant past medical, surgical, family and social history reviewed and updated as indicated. Interim medical history since our last visit reviewed. Allergies and  medications reviewed and updated.  Review of Systems  Constitutional: Negative.   Respiratory: Negative.   Cardiovascular: Negative.   Gastrointestinal: Negative.   Psychiatric/Behavioral: Negative.     Per HPI unless specifically indicated above     Objective:    BP 111/72 mmHg  Pulse 75  Temp(Src) 97.2 F (36.2 C)  Ht 5' 11.3" (1.811 m)  Wt 240 lb (108.863 kg)  BMI 33.19 kg/m2  SpO2 98%  Wt Readings from Last 3 Encounters:  03/21/15 240 lb (108.863 kg)  08/15/14 200 lb (90.719 kg)    Physical Exam  Constitutional: He is oriented to person, place, and time. He appears well-developed and well-nourished. No distress.  HENT:  Head: Normocephalic and atraumatic.  Right Ear: Hearing normal.  Left Ear: Hearing normal.  Nose: Nose normal.  Eyes: Conjunctivae and lids are normal. Right eye exhibits no discharge. Left eye exhibits no discharge. No scleral icterus.  Cardiovascular: Normal rate, regular rhythm, normal heart sounds and intact distal pulses.  Exam reveals no gallop and no friction rub.   No murmur heard. Pulmonary/Chest: Effort normal and breath sounds normal. No respiratory distress. He has no wheezes. He has no rales. He exhibits no tenderness.  Abdominal: Soft. Bowel sounds are normal. He exhibits no distension and no mass. There is no tenderness. There is no rebound and no guarding.  Musculoskeletal: Normal range of motion.  Neurological: He is alert and oriented to person, place, and time.  Skin: Skin is warm, dry and intact. No rash noted.  No erythema. No pallor.  Psychiatric: He has a normal mood and affect. His speech is normal and behavior is normal. Judgment and thought content normal. Cognition and memory are normal.  Nursing note and vitals reviewed.   Results for orders placed or performed during the hospital encounter of 08/15/14  CBC  Result Value Ref Range   WBC 21.0 (H) 4.0 - 10.5 K/uL   RBC 2.97 (L) 4.22 - 5.81 MIL/uL   Hemoglobin 9.7 (L) 13.0  - 17.0 g/dL   HCT 29.8 (L) 39.0 - 52.0 %   MCV 100.3 (H) 78.0 - 100.0 fL   MCH 32.7 26.0 - 34.0 pg   MCHC 32.6 30.0 - 36.0 g/dL   RDW 12.3 11.5 - 15.5 %   Platelets 628 (H) 150 - 400 K/uL  Comprehensive metabolic panel  Result Value Ref Range   Sodium 136 135 - 145 mmol/L   Potassium 4.8 3.5 - 5.1 mmol/L   Chloride 101 96 - 112 mmol/L   CO2 23 19 - 32 mmol/L   Glucose, Bld 90 70 - 99 mg/dL   BUN 12 6 - 23 mg/dL   Creatinine, Ser 0.69 0.50 - 1.35 mg/dL   Calcium 8.9 8.4 - 10.5 mg/dL   Total Protein 8.1 6.0 - 8.3 g/dL   Albumin 2.8 (L) 3.5 - 5.2 g/dL   AST 40 (H) 0 - 37 U/L   ALT 40 0 - 53 U/L   Alkaline Phosphatase 72 39 - 117 U/L   Total Bilirubin 0.7 0.3 - 1.2 mg/dL   GFR calc non Af Amer >90 >90 mL/min   GFR calc Af Amer >90 >90 mL/min   Anion gap 12 5 - 15  Magnesium  Result Value Ref Range   Magnesium 1.9 1.5 - 2.5 mg/dL  Phosphorus  Result Value Ref Range   Phosphorus 5.1 (H) 2.3 - 4.6 mg/dL  Lipase, blood  Result Value Ref Range   Lipase 89 (H) 11 - 59 U/L  Basic metabolic panel  Result Value Ref Range   Sodium 134 (L) 135 - 145 mmol/L   Potassium 4.3 3.5 - 5.1 mmol/L   Chloride 99 96 - 112 mmol/L   CO2 25 19 - 32 mmol/L   Glucose, Bld 94 70 - 99 mg/dL   BUN 11 6 - 23 mg/dL   Creatinine, Ser 0.72 0.50 - 1.35 mg/dL   Calcium 8.4 8.4 - 10.5 mg/dL   GFR calc non Af Amer >90 >90 mL/min   GFR calc Af Amer >90 >90 mL/min   Anion gap 10 5 - 15  CBC  Result Value Ref Range   WBC 16.8 (H) 4.0 - 10.5 K/uL   RBC 2.74 (L) 4.22 - 5.81 MIL/uL   Hemoglobin 9.0 (L) 13.0 - 17.0 g/dL   HCT 27.0 (L) 39.0 - 52.0 %   MCV 98.5 78.0 - 100.0 fL   MCH 32.8 26.0 - 34.0 pg   MCHC 33.3 30.0 - 36.0 g/dL   RDW 12.2 11.5 - 15.5 %   Platelets 844 (H) 150 - 400 K/uL  Lipase, blood  Result Value Ref Range   Lipase 99 (H) 11 - 59 U/L  Ammonia  Result Value Ref Range   Ammonia 19 11 - 32 umol/L  Urine rapid drug screen (hosp performed)  Result Value Ref Range   Opiates POSITIVE  (A) NONE DETECTED   Cocaine NONE DETECTED NONE DETECTED   Benzodiazepines NONE DETECTED NONE DETECTED   Amphetamines NONE DETECTED NONE DETECTED  Tetrahydrocannabinol NONE DETECTED NONE DETECTED   Barbiturates NONE DETECTED NONE DETECTED  Lipase, blood  Result Value Ref Range   Lipase 96 (H) 11 - 59 U/L  Vitamin B12  Result Value Ref Range   Vitamin B-12 1971 (H) 211 - 911 pg/mL  Folate  Result Value Ref Range   Folate 11.8 ng/mL  Iron and TIBC  Result Value Ref Range   Iron 25 (L) 42 - 165 ug/dL   TIBC 130 (L) 215 - 435 ug/dL   Saturation Ratios 19 (L) 20 - 55 %   UIBC 105 (L) 125 - 400 ug/dL  Ferritin  Result Value Ref Range   Ferritin 1157 (H) 22 - 322 ng/mL  Reticulocytes  Result Value Ref Range   Retic Ct Pct 2.7 0.4 - 3.1 %   RBC. 2.46 (L) 4.22 - 5.81 MIL/uL   Retic Count, Manual 66.4 19.0 - 186.0 K/uL  CBC  Result Value Ref Range   WBC 13.7 (H) 4.0 - 10.5 K/uL   RBC 2.46 (L) 4.22 - 5.81 MIL/uL   Hemoglobin 7.9 (L) 13.0 - 17.0 g/dL   HCT 24.2 (L) 39.0 - 52.0 %   MCV 98.4 78.0 - 100.0 fL   MCH 32.1 26.0 - 34.0 pg   MCHC 32.6 30.0 - 36.0 g/dL   RDW 12.2 11.5 - 15.5 %   Platelets 887 (H) 150 - 400 K/uL  Comprehensive metabolic panel  Result Value Ref Range   Sodium 134 (L) 135 - 145 mmol/L   Potassium 4.0 3.5 - 5.1 mmol/L   Chloride 102 96 - 112 mmol/L   CO2 25 19 - 32 mmol/L   Glucose, Bld 101 (H) 70 - 99 mg/dL   BUN 8 6 - 23 mg/dL   Creatinine, Ser 0.73 0.50 - 1.35 mg/dL   Calcium 8.5 8.4 - 10.5 mg/dL   Total Protein 7.2 6.0 - 8.3 g/dL   Albumin 2.4 (L) 3.5 - 5.2 g/dL   AST 25 0 - 37 U/L   ALT 33 0 - 53 U/L   Alkaline Phosphatase 60 39 - 117 U/L   Total Bilirubin 0.5 0.3 - 1.2 mg/dL   GFR calc non Af Amer >90 >90 mL/min   GFR calc Af Amer >90 >90 mL/min   Anion gap 7 5 - 15  HIV antibody  Result Value Ref Range   HIV Screen 4th Generation wRfx Non Reactive Non Reactive  TSH  Result Value Ref Range   TSH 1.605 0.350 - 4.500 uIU/mL  RPR  Result  Value Ref Range   RPR Ser Ql Non Reactive Non Reactive  Lipase, blood  Result Value Ref Range   Lipase 105 (H) 11 - 59 U/L  CBC  Result Value Ref Range   WBC 11.4 (H) 4.0 - 10.5 K/uL   RBC 2.52 (L) 4.22 - 5.81 MIL/uL   Hemoglobin 8.2 (L) 13.0 - 17.0 g/dL   HCT 24.7 (L) 39.0 - 52.0 %   MCV 98.0 78.0 - 100.0 fL   MCH 32.5 26.0 - 34.0 pg   MCHC 33.2 30.0 - 36.0 g/dL   RDW 12.2 11.5 - 15.5 %   Platelets 829 (H) 150 - 400 K/uL  Basic metabolic panel  Result Value Ref Range   Sodium 137 135 - 145 mmol/L   Potassium 3.9 3.5 - 5.1 mmol/L   Chloride 106 96 - 112 mmol/L   CO2 24 19 - 32 mmol/L   Glucose, Bld 111 (H) 70 - 99 mg/dL  BUN 5 (L) 6 - 23 mg/dL   Creatinine, Ser 0.64 0.50 - 1.35 mg/dL   Calcium 8.3 (L) 8.4 - 10.5 mg/dL   GFR calc non Af Amer >90 >90 mL/min   GFR calc Af Amer >90 >90 mL/min   Anion gap 7 5 - 15  Vitamin B12  Result Value Ref Range   Vitamin B-12 >2000 (H) 211 - 911 pg/mL  CBC  Result Value Ref Range   WBC 10.9 (H) 4.0 - 10.5 K/uL   RBC 2.47 (L) 4.22 - 5.81 MIL/uL   Hemoglobin 8.1 (L) 13.0 - 17.0 g/dL   HCT 24.2 (L) 39.0 - 52.0 %   MCV 98.0 78.0 - 100.0 fL   MCH 32.8 26.0 - 34.0 pg   MCHC 33.5 30.0 - 36.0 g/dL   RDW 12.4 11.5 - 15.5 %   Platelets 770 (H) 150 - 400 K/uL  Basic metabolic panel  Result Value Ref Range   Sodium 140 135 - 145 mmol/L   Potassium 3.8 3.5 - 5.1 mmol/L   Chloride 108 96 - 112 mmol/L   CO2 24 19 - 32 mmol/L   Glucose, Bld 100 (H) 70 - 99 mg/dL   BUN <5 (L) 6 - 23 mg/dL   Creatinine, Ser 0.73 0.50 - 1.35 mg/dL   Calcium 8.1 (L) 8.4 - 10.5 mg/dL   GFR calc non Af Amer >90 >90 mL/min   GFR calc Af Amer >90 >90 mL/min   Anion gap 8 5 - 15  CBC  Result Value Ref Range   WBC 10.3 4.0 - 10.5 K/uL   RBC 2.56 (L) 4.22 - 5.81 MIL/uL   Hemoglobin 8.3 (L) 13.0 - 17.0 g/dL   HCT 24.8 (L) 39.0 - 52.0 %   MCV 96.9 78.0 - 100.0 fL   MCH 32.4 26.0 - 34.0 pg   MCHC 33.5 30.0 - 36.0 g/dL   RDW 12.3 11.5 - 15.5 %   Platelets 712 (H)  150 - 400 K/uL  Basic metabolic panel  Result Value Ref Range   Sodium 137 135 - 145 mmol/L   Potassium 3.9 3.5 - 5.1 mmol/L   Chloride 107 96 - 112 mmol/L   CO2 25 19 - 32 mmol/L   Glucose, Bld 102 (H) 70 - 99 mg/dL   BUN <5 (L) 6 - 23 mg/dL   Creatinine, Ser 0.75 0.50 - 1.35 mg/dL   Calcium 8.4 8.4 - 10.5 mg/dL   GFR calc non Af Amer >90 >90 mL/min   GFR calc Af Amer >90 >90 mL/min   Anion gap 5 5 - 15      Assessment & Plan:   Problem List Items Addressed This Visit      Other   Alcohol abuse - Primary (Chronic)    In remission at this time. No interested in medication or counseling or meetings at this time. Continue to monitor.       Normocytic anemia    Refused CBC today. Advised him that when he left the hospital he was still quite low and that would make it hard for him to feel normal- will check labs at his physical.       Depression    Has been doing well on his celexa. Refill on medication given today. Call if not doing better in 1 month. Follow up 3 months for PE.      Relevant Medications   citalopram (CELEXA) 20 MG tablet       Follow up  plan: Return in about 3 months (around 06/20/2015).

## 2015-03-21 NOTE — Assessment & Plan Note (Signed)
Has been doing well on his celexa. Refill on medication given today. Call if not doing better in 1 month. Follow up 3 months for PE.

## 2015-03-21 NOTE — Assessment & Plan Note (Signed)
In remission at this time. No interested in medication or counseling or meetings at this time. Continue to monitor.

## 2015-06-07 ENCOUNTER — Ambulatory Visit: Payer: Self-pay | Admitting: Family Medicine

## 2015-09-27 ENCOUNTER — Emergency Department
Admission: EM | Admit: 2015-09-27 | Discharge: 2015-09-27 | Disposition: A | Discharge status: Home / Self Care | Payer: Self-pay | Attending: Emergency Medicine | Admitting: Emergency Medicine

## 2015-09-27 ENCOUNTER — Encounter: Payer: Self-pay | Admitting: Emergency Medicine

## 2015-09-27 DIAGNOSIS — F17210 Nicotine dependence, cigarettes, uncomplicated: Secondary | ICD-10-CM | POA: Insufficient documentation

## 2015-09-27 DIAGNOSIS — Z79899 Other long term (current) drug therapy: Secondary | ICD-10-CM | POA: Insufficient documentation

## 2015-09-27 DIAGNOSIS — B86 Scabies: Secondary | ICD-10-CM | POA: Insufficient documentation

## 2015-09-27 DIAGNOSIS — F329 Major depressive disorder, single episode, unspecified: Secondary | ICD-10-CM | POA: Insufficient documentation

## 2015-09-27 DIAGNOSIS — L03116 Cellulitis of left lower limb: Secondary | ICD-10-CM | POA: Insufficient documentation

## 2015-09-27 MED ORDER — PERMETHRIN 5 % EX CREA: TOPICAL_CREAM | CUTANEOUS | Status: AC

## 2015-09-27 MED ORDER — CEPHALEXIN 500 MG PO CAPS: 500.0000 mg | ORAL_CAPSULE | Freq: Two times a day (BID) | ORAL | Status: AC

## 2015-09-27 NOTE — ED Notes (Signed)
Rash all over.  Says it has been spreading for a few weeks.

## 2015-09-27 NOTE — ED Notes (Signed)
States he developed a rash to both lower legs a few days ago  Both ankle sl swollen  Then rash has spread to both arms and abd area. No resp distress

## 2015-09-27 NOTE — ED Provider Notes (Signed)
[facility] Emergency Department Provider Note  ____________________________________________  Time seen: On arrival  I have reviewed the triage vital signs and the nursing notes.   HISTORY  Chief Complaint Rash    HPI Brandon Oneill is a 35 y.o. male who presents with complaints of a rash. He reports the rash is been developing over the last couple of months. It started on his feet and is now his abdomen and his arms. It is fine, red and itchy. He also notes worsening redness and discomfort to his left foot. He admits to sleeping on his friend's couch for the last 3 months.    Past Medical History  Diagnosis Date  . Acute alcoholic pancreatitis [id number]    CT 08/15/14 shows evolving pseudocyts.    . Alcoholism ([place]) 08/2014    Chronic. Detox admits x 3, one to [place].   . Alcohol withdrawal seizure ([place]) 2013  . Delirium tremens ([place]) 07/2014  . Anemia 08/2014    macrocytic    Patient Active Problem List   Diagnosis Date Noted  . Depression 03/21/2015  . Acute pancreatitis   . Pancreatitis, acute 08/16/2014  . Acute encephalopathy 08/16/2014  . Pancreatic pseudocyst 08/16/2014  . Alcohol abuse 08/16/2014  . Normocytic anemia 08/16/2014    Past Surgical History  Procedure Laterality Date  . Incision and drainage abscess  2008    located on low back    Current Outpatient Rx  Name  Route  Sig  Dispense  Refill  . cephALEXin (KEFLEX) 500 MG capsule   Oral   Take 1 capsule (500 mg total) by mouth 2 (two) times daily.   14 capsule   0   . citalopram (CELEXA) 20 MG tablet   Oral   Take 1 tablet (20 mg total) by mouth daily.   90 tablet   0   . permethrin (ELIMITE) 5 % cream      Thoroughly massage cream from head to soles of feet leave on for 8-12 hours then rinse off   60 g   1     Allergies Erythromycin  Family History  Problem Relation Age of Onset  . Alcoholism Father     Social History Social History  Substance Use  Topics  . Smoking status: Current Every Day Smoker -- 0.50 packs/day for 20 years    Types: Cigarettes  . Smokeless tobacco: Never Used  . Alcohol Use: 0.0 oz/week    0 Standard drinks or equivalent per week     Comment: 04/2014: [facility] RX'd Naltrexone, not sure if he ever used this.     Review of Systems  Constitutional: Negative for fever. Eyes: Negative for visual changes.     Musculoskeletal: Negative for back pain. Skin:Positive for rash as above  Neurological: Negative for headaches   ____________________________________________   PHYSICAL EXAM:  VITAL SIGNS: ED Triage Vitals  Enc Vitals Group     BP 09/27/15 1230 144/83 mmHg     Pulse Rate 09/27/15 1230 86     Resp 09/27/15 1230 18     Temp 09/27/15 1230 98.1 F (36.7 C)     Temp Source 09/27/15 1230 Oral     SpO2 09/27/15 1230 98 %     Weight 09/27/15 1315 240 lb (108.863 kg)     Height --      Head Cir --      Peak Flow --      Pain Score 09/27/15 1219 2  Pain Loc --      Pain Edu? --      Excl. in [place]? --     Constitutional: Alert and oriented. Well appearing and in no distress. Eyes: Conjunctivae are normal.  ENT   Head: Normocephalic and atraumatic.   Mouth/Throat: Mucous membranes are moist. Cardiovascular: Normal rate, regular rhythm.  Respiratory: Normal respiratory effort without tachypnea nor retractions.  Gastrointestinal: Soft and non-tender in all quadrants. No distention. There is no CVA tenderness. Musculoskeletal: Nontender with normal range of motion in all extremities. Neurologic:  Normal speech and language. No gross focal neurologic deficits are appreciated. Skin:  Skin is warm, dry and intact. fine erythematous rash to the extremities, worse on the lower extremities. Confluent erythema around the left ankle suspicious for cellulitis. Rash appears consistent with scabies  Psychiatric: Mood and affect are normal. Patient exhibits appropriate insight and  judgment.  ____________________________________________    LABS (pertinent positives/negatives)  Labs Reviewed - No data to display  ____________________________________________     ____________________________________________    RADIOLOGY I have personally reviewed any xrays that were ordered on this patient: None  ____________________________________________   PROCEDURES  Procedure(s) performed: none   ____________________________________________   INITIAL IMPRESSION / ASSESSMENT AND PLAN / ED COURSE  Pertinent labs & imaging results that were available during my care of the patient were reviewed by me and considered in my medical decision making (see chart for details).   rash is suspicious for scabies and possible cellulitis developing secondarily on the left ankle. We will prescribe Keflex and permethrin. Patient agrees to follow up with his PCP  ____________________________________________   FINAL CLINICAL IMPRESSION(S) / ED DIAGNOSES  Final diagnoses:  Scabies  Cellulitis of left lower extremity     Lavonia Drafts, MD 09/27/15 1425

## 2015-09-27 NOTE — Discharge Instructions (Signed)
Cellulitis Cellulitis is an infection of the skin and the tissue beneath it. The infected area is usually red and tender. Cellulitis occurs most often in the arms and lower legs.  CAUSES  Cellulitis is caused by bacteria that enter the skin through cracks or cuts in the skin. The most common types of bacteria that cause cellulitis are staphylococci and streptococci. SIGNS AND SYMPTOMS   Redness and warmth.  Swelling.  Tenderness or pain.  Fever. DIAGNOSIS  Your health care provider can usually determine what is wrong based on a physical exam. Blood tests may also be done. TREATMENT  Treatment usually involves taking an antibiotic medicine. HOME CARE INSTRUCTIONS   Take your antibiotic medicine as directed by your health care provider. Finish the antibiotic even if you start to feel better.  Keep the infected arm or leg elevated to reduce swelling.  Apply a warm cloth to the affected area up to 4 times per day to relieve pain.  Take medicines only as directed by your health care provider.  Keep all follow-up visits as directed by your health care provider. SEEK MEDICAL CARE IF:   You notice red streaks coming from the infected area.  Your red area gets larger or turns dark in color.  Your bone or joint underneath the infected area becomes painful after the skin has healed.  Your infection returns in the same area or another area.  You notice a swollen bump in the infected area.  You develop new symptoms.  You have a fever. SEEK IMMEDIATE MEDICAL CARE IF:   You feel very sleepy.  You develop vomiting or diarrhea.  You have a general ill feeling (malaise) with muscle aches and pains.   This information is not intended to replace advice given to you by your health care provider. Make sure you discuss any questions you have with your health care provider.   Document Released: 04/02/2005 Document Revised: 03/14/2015 Document Reviewed: 09/08/2011 Elsevier Interactive  Patient Education 2016 [employer].  Scabies, Adult Scabies is a skin condition that happens when very small insects get under the skin (infestation). This causes a rash and severe itchiness. Scabies can spread from person to person (is contagious). If you get scabies, it is common for others in your household to get scabies too. With proper treatment, symptoms usually go away in 2-4 weeks. Scabies usually does not cause lasting problems. CAUSES This condition is caused by mites (Sarcoptes scabiei, or human itch mites) that can only be seen with a microscope. The mites get into the top layer of skin and lay eggs. Scabies can spread from person to person through:  Close contact with a person who has scabies.  Contact with infested items, such as towels, bedding, or clothing. RISK FACTORS This condition is more likely to develop in:  People who live in nursing homes and other extended-care facilities.  People who have sexual contact with a partner who has scabies.  Young children who attend child care facilities.  People who care for others who are at increased risk for scabies. SYMPTOMS Symptoms of this condition may include:  Severe itchiness. This is often worse at night.  A rash that includes tiny red bumps or blisters. The rash commonly occurs on the wrist, elbow, armpit, fingers, waist, groin, or buttocks. Bumps may form a line (burrow) in some areas.  Skin irritation. This can include scaly patches or sores. DIAGNOSIS This condition is diagnosed with a physical exam. Your health care provider will look closely  at your skin. In some cases, your health care provider may take a sample of your affected skin (skin scraping) and have it examined under a microscope. TREATMENT This condition may be treated with:  Medicated cream or lotion that kills the mites. This is spread on the entire body and left on for several hours. Usually, one treatment with medicated cream or lotion is  enough to kill all of the mites. In severe cases, the treatment may be repeated.  Medicated cream that relieves itching.  Medicines that help to relieve itching.  Medicines that kill the mites. This treatment is rarely used. HOME CARE INSTRUCTIONS Medicines  Take or apply over-the-counter and prescription medicines as told by your health care provider.  Apply medicated cream or lotion as told by your health care provider.  Do not wash off the medicated cream or lotion until the necessary amount of time has passed. Skin Care  Avoid scratching your affected skin.  Keep your fingernails closely trimmed to reduce injury from scratching.  Take cool baths or apply cool washcloths to help reduce itching. General Instructions  Clean all items that you recently had contact with, including bedding, clothing, and furniture. Do this on the same day that your treatment starts.  Use hot water when you wash items.  Place unwashable items into closed, airtight plastic bags for at least 3 days. The mites cannot live for more than 3 days away from human skin.  Vacuum furniture and mattresses that you use.  Make sure that other people who may have been infested are examined by a health care provider. These include members of your household and anyone who may have had contact with infested items.  Keep all follow-up visits as told by your health care provider. This is important. SEEK MEDICAL CARE IF:  You have itching that does not go away after 4 weeks of treatment.  You continue to develop new bumps or burrows.  You have redness, swelling, or pain in your rash area after treatment.  You have fluid, blood, or pus coming from your rash.   This information is not intended to replace advice given to you by your health care provider. Make sure you discuss any questions you have with your health care provider.   Document Released: 03/14/2015 Document Reviewed: 01/23/2015 Elsevier Interactive  Patient Education [employer].

## 2015-10-04 ENCOUNTER — Encounter: Payer: Self-pay | Admitting: Emergency Medicine

## 2015-10-04 ENCOUNTER — Emergency Department
Admission: EM | Admit: 2015-10-04 | Discharge: 2015-10-04 | Disposition: A | Discharge status: Home / Self Care | Payer: Self-pay | Attending: Emergency Medicine | Admitting: Emergency Medicine

## 2015-10-04 DIAGNOSIS — Z23 Encounter for immunization: Secondary | ICD-10-CM | POA: Insufficient documentation

## 2015-10-04 DIAGNOSIS — F1010 Alcohol abuse, uncomplicated: Secondary | ICD-10-CM

## 2015-10-04 DIAGNOSIS — Y9289 Other specified places as the place of occurrence of the external cause: Secondary | ICD-10-CM | POA: Insufficient documentation

## 2015-10-04 DIAGNOSIS — W1839XA Other fall on same level, initial encounter: Secondary | ICD-10-CM | POA: Insufficient documentation

## 2015-10-04 DIAGNOSIS — Y998 Other external cause status: Secondary | ICD-10-CM | POA: Insufficient documentation

## 2015-10-04 DIAGNOSIS — Y9389 Activity, other specified: Secondary | ICD-10-CM | POA: Insufficient documentation

## 2015-10-04 DIAGNOSIS — S60512A Abrasion of left hand, initial encounter: Secondary | ICD-10-CM | POA: Insufficient documentation

## 2015-10-04 DIAGNOSIS — S60511A Abrasion of right hand, initial encounter: Secondary | ICD-10-CM | POA: Insufficient documentation

## 2015-10-04 DIAGNOSIS — F10230 Alcohol dependence with withdrawal, uncomplicated: Secondary | ICD-10-CM | POA: Insufficient documentation

## 2015-10-04 DIAGNOSIS — F17210 Nicotine dependence, cigarettes, uncomplicated: Secondary | ICD-10-CM | POA: Insufficient documentation

## 2015-10-04 DIAGNOSIS — S80211A Abrasion, right knee, initial encounter: Secondary | ICD-10-CM | POA: Insufficient documentation

## 2015-10-04 DIAGNOSIS — R21 Rash and other nonspecific skin eruption: Secondary | ICD-10-CM | POA: Insufficient documentation

## 2015-10-04 MED ORDER — TETANUS-DIPHTH-ACELL PERTUSSIS 5-2.5-18.5 LF-MCG/0.5 IM SUSP
0.5000 mL | Freq: Once | INTRAMUSCULAR | Status: AC
  Administered 2015-10-04: 0.5 mL via INTRAMUSCULAR
  Filled 2015-10-04: qty 0.5

## 2015-10-04 MED ORDER — BACITRACIN ZINC 500 UNIT/GM EX OINT
TOPICAL_OINTMENT | CUTANEOUS | Status: AC
  Filled 2015-10-04: qty 1.8

## 2015-10-04 MED ORDER — IVERMECTIN 3 MG PO TABS: 200.0000 ug/kg | ORAL_TABLET | Freq: Once | ORAL | Status: AC

## 2015-10-04 NOTE — ED Notes (Signed)
Pt reports that he "can and has" quit drinking but he is "self-medicating" to treat the scabies.

## 2015-10-04 NOTE — ED Notes (Signed)
Pt states he last drank around 1230 today, eiher 2 or 3 40 oz beers

## 2015-10-04 NOTE — ED Provider Notes (Signed)
[facility] Emergency Department Provider Note    ____________________________________________  Time seen: ~1600  I have reviewed the triage vital signs and the nursing notes.   HISTORY  Chief Complaint Fall; Rash; and Alcohol Problem   History limited by: Not Limited, some history obtained from chart review   HPI Brandon Oneill is a 35 y.o. male who presents to the emergency department today with 3 complaints.  Complaint #1 is a rash. This rash is located somewhat diffusely throughout his body. It is worse in his bilateral lower legs. This is now the third emergency Department visit he has had for this rash. He states that it is itchy. He was initially diagnosed with scabies in this emergency department. He then went and saw [facility] emergency Department a couple of days ago. He is received multiple prescriptions for permethrin. Additionally at [facility] they brought in his antibiotic coverage to include Bactrim as well as Keflex he was prescribed here. Furthermore they gave him hydrocortisone cream prescription as well asantifungal prescription. Additionally they gave the patient with primary care and [facility] dermatology follow-up.  Complaint #2 is a fall. The patient states that he wanted call [facility] to get here today. He states he did fall. He scraped bilateral palms as well as knee. He is having some pain after the fall. He is unsure when his last tetanus shot was.  Complaint #3 is alcohol abuse. Patient states that he is a heavy alcohol abuser. He was requesting detox. He states he last drank alcohol today.    Past Medical History  Diagnosis Date  . Acute alcoholic pancreatitis [id number]    CT 08/15/14 shows evolving pseudocyts.    . Alcoholism ([place]) 08/2014    Chronic. Detox admits x 3, one to [place].   . Alcohol withdrawal seizure ([place]) 2013  . Delirium tremens ([place]) 07/2014  . Anemia 08/2014    macrocytic    Patient Active Problem List   Diagnosis Date Noted    . Depression 03/21/2015  . Acute pancreatitis   . Pancreatitis, acute 08/16/2014  . Acute encephalopathy 08/16/2014  . Pancreatic pseudocyst 08/16/2014  . Alcohol abuse 08/16/2014  . Normocytic anemia 08/16/2014    Past Surgical History  Procedure Laterality Date  . Incision and drainage abscess  2008    located on low back     Allergies Erythromycin  Family History  Problem Relation Age of Onset  . Alcoholism Father     Social History Social History  Substance Use Topics  . Smoking status: Current Every Day Smoker -- 0.50 packs/day for 20 years    Types: Cigarettes  . Smokeless tobacco: Never Used  . Alcohol Use: 0.0 oz/week    0 Standard drinks or equivalent per week     Comment: 4-6 x 40 oz per day     Review of Systems  Constitutional: Negative for fever. Cardiovascular: Negative for chest pain. Respiratory: Negative for shortness of breath. Gastrointestinal: Negative for abdominal pain, vomiting and diarrhea. Musculoskeletal: Negative for back pain.Positive for bilateral palm pain Skin: Positive for rash. Neurological: Negative for headaches, focal weakness or numbness.   10-point ROS otherwise negative.  ____________________________________________   PHYSICAL EXAM:  VITAL SIGNS:   98.1 F (36.7 C)  92  --   141/90 mmHg  97 %    Constitutional: Alert and oriented. Well appearing and in no distress. Eyes: Conjunctivae are normal. PERRL. Normal extraocular movements. ENT   Head: Normocephalic and atraumatic.   Nose: No congestion/rhinnorhea.  Mouth/Throat: Mucous membranes are moist.   Neck: No stridor. Hematological/Lymphatic/Immunilogical: No cervical lymphadenopathy. Cardiovascular: Normal rate, regular rhythm.  No murmurs, rubs, or gallops. Respiratory: Normal respiratory effort without tachypnea nor retractions. Breath sounds are clear and equal bilaterally. No wheezes/rales/rhonchi. Gastrointestinal: Soft and nontender. No  distention. There is no CVA tenderness. Genitourinary: Deferred Musculoskeletal: Normal range of motion in all extremities. No joint effusions.  No lower extremity tenderness nor edema. Small abrasions over bilateral palms and right knee. No deformity or tenderness to those sites. NV intact distally. Neurologic:  Normal speech and language. No gross focal neurologic deficits are appreciated.  Skin:  Abrasions over bilateral palms and right knee. Rash diffusely over his body. Worse in the lower legs, dense erythematous rash. Non tender no warmth. No interdigit rash or findings classically consistent with scabies. Psychiatric: Mood and affect are normal. Speech and behavior are normal. Patient exhibits appropriate insight and judgment.  ____________________________________________    LABS (pertinent positives/negatives)  None  ____________________________________________   EKG  None  ____________________________________________    RADIOLOGY  None  ____________________________________________   PROCEDURES  Procedure(s) performed: None  Critical Care performed: No  ____________________________________________   INITIAL IMPRESSION / ASSESSMENT AND PLAN / ED COURSE  Pertinent labs & imaging results that were available during my care of the patient were reviewed by me and considered in my medical decision making (see chart for details).  Complaint #1 the rash. The rash does not appear to be classic for scabies. However patient states he still has concerns that it might be scabies. He states he is tried permethrin twice. Because of this I will give him oral ivermectin. However I do think it is more likely a fungal rash. He was prescribed fluconazole at [facility]. While patient continue this. Additionally patient is known to antibiotic regimens in case it is cellulitis. Will have patient continue these. Furthermore I will give patient additional [place] dermatology follow-up although  he is already received [facility] dermatology information as well.  Complaint #2 the fall. Patient does have small abrasions over his palms and right knee. No signs or symptoms concerning for acute osseous injury. I do not think emergent radiographic exams are warranted at this time. We will however update patient's tetanus.  Complaint #3 alcohol abuse. Will give patient information for [facility].  ____________________________________________   FINAL CLINICAL IMPRESSION(S) / ED DIAGNOSES  Final diagnoses:  Rash  Alcohol abuse     Nance Pear, MD 10/04/15 1620

## 2015-10-04 NOTE — ED Notes (Signed)
Pt discharged home after verbalizing understanding of discharge instructions; nad noted. 

## 2015-10-04 NOTE — Discharge Instructions (Signed)
Please seek medical attention for any high fevers, chest pain, shortness of breath, change in behavior, persistent vomiting, bloody stool or any other new or concerning symptoms. ° ° °Alcohol Use Disorder °Alcohol use disorder is a mental disorder. It is not a one-time incident of heavy drinking. Alcohol use disorder is the excessive and uncontrollable use of alcohol over time that leads to problems with functioning in one or more areas of daily living. People with this disorder risk harming themselves and others when they drink to excess. Alcohol use disorder also can cause other mental disorders, such as mood and anxiety disorders, and serious physical problems. People with alcohol use disorder often misuse other drugs.  °Alcohol use disorder is common and widespread. Some people with this disorder drink alcohol to cope with or escape from negative life events. Others drink to relieve chronic pain or symptoms of mental illness. People with a family history of alcohol use disorder are at higher risk of losing control and using alcohol to excess.  °Drinking too much alcohol can cause injury, accidents, and health problems. One drink can be too much when you are: °· Working. °· Pregnant or breastfeeding. °· Taking medicines. Ask your doctor. °· Driving or planning to drive. °SYMPTOMS  °Signs and symptoms of alcohol use disorder may include the following:  °· Consumption of alcohol in larger amounts or over a longer period of time than intended. °· Multiple unsuccessful attempts to cut down or control alcohol use.   °· A great deal of time spent obtaining alcohol, using alcohol, or recovering from the effects of alcohol (hangover). °· A strong desire or urge to use alcohol (cravings).   °· Continued use of alcohol despite problems at work, school, or home because of alcohol use.   °· Continued use of alcohol despite problems in relationships because of alcohol use. °· Continued use of alcohol in situations when it is  physically hazardous, such as driving a car. °· Continued use of alcohol despite awareness of a physical or psychological problem that is likely related to alcohol use. Physical problems related to alcohol use can involve the brain, heart, liver, stomach, and intestines. Psychological problems related to alcohol use include intoxication, depression, anxiety, psychosis, delirium, and dementia.   °· The need for increased amounts of alcohol to achieve the same desired effect, or a decreased effect from the consumption of the same amount of alcohol (tolerance). °· Withdrawal symptoms upon reducing or stopping alcohol use, or alcohol use to reduce or avoid withdrawal symptoms. Withdrawal symptoms include: °¨ Racing heart. °¨ Hand tremor. °¨ Difficulty sleeping. °¨ Nausea. °¨ Vomiting. °¨ Hallucinations. °¨ Restlessness. °¨ Seizures. °DIAGNOSIS °Alcohol use disorder is diagnosed through an assessment by your health care provider. Your health care provider may start by asking three or four questions to screen for excessive or problematic alcohol use. To confirm a diagnosis of alcohol use disorder, at least two symptoms must be present within a 12-month period. The severity of alcohol use disorder depends on the number of symptoms: °· Mild--two or three. °· Moderate--four or five. °· Severe--six or more. °Your health care provider may perform a physical exam or use results from lab tests to see if you have physical problems resulting from alcohol use. Your health care provider may refer you to a mental health professional for evaluation. °TREATMENT  °Some people with alcohol use disorder are able to reduce their alcohol use to low-risk levels. Some people with alcohol use disorder need to quit drinking alcohol. When necessary, mental health professionals with specialized   training in substance use treatment can help. Your health care provider can help you decide how severe your alcohol use disorder is and what type of  treatment you need. The following forms of treatment are available:  °· Detoxification. Detoxification involves the use of prescription medicines to prevent alcohol withdrawal symptoms in the first week after quitting. This is important for people with a history of symptoms of withdrawal and for heavy drinkers who are likely to have withdrawal symptoms. Alcohol withdrawal can be dangerous and, in severe cases, cause death. Detoxification is usually provided in a hospital or in-patient substance use treatment facility. °· Counseling or talk therapy. Talk therapy is provided by substance use treatment counselors. It addresses the reasons people use alcohol and ways to keep them from drinking again. The goals of talk therapy are to help people with alcohol use disorder find healthy activities and ways to cope with life stress, to identify and avoid triggers for alcohol use, and to handle cravings, which can cause relapse. °· Medicines. Different medicines can help treat alcohol use disorder through the following actions: °¨ Decrease alcohol cravings. °¨ Decrease the positive reward response felt from alcohol use. °¨ Produce an uncomfortable physical reaction when alcohol is used (aversion therapy). °· Support groups. Support groups are run by people who have quit drinking. They provide emotional support, advice, and guidance. °These forms of treatment are often combined. Some people with alcohol use disorder benefit from intensive combination treatment provided by specialized substance use treatment centers. Both inpatient and outpatient treatment programs are available. °  °This information is not intended to replace advice given to you by your health care provider. Make sure you discuss any questions you have with your health care provider. °  °Document Released: 07/31/2004 Document Revised: 07/14/2014 Document Reviewed: 09/30/2012 °Elsevier Interactive Patient Education ©2016 Elsevier Inc. ° °

## 2015-10-04 NOTE — ED Notes (Signed)
Pt walked 12 miles to get here for treatment of scabies and detox, and fell on the way, injuring hands and knee. Pt states he drinks 4-6 40 oz beers per night x 3 months. Pt works but is taking medical leave from job to get help. He was previously treated for scabies 1 week ago but he has not gotten better.

## 2017-01-02 IMAGING — CT CT HEAD W/O CM
1 of 2 series · 16 of 30 positions shown, 20 images · non-contrast
Comparison: 07/29/12

CLINICAL DATA: Initial evaluation involuntary body spasms,
incoherent, confused, garbled speech

EXAM:
CT HEAD WITHOUT CONTRAST
TECHNIQUE: Contiguous axial images were obtained from the base of the skull
through the vertex without intravenous contrast.

[Series 2: headseq 4.8 h45s · axial · 0.43mm/px · z∈[-114,+38]mm · 16 of 36 slices shown, 20 images]
[im 2/36  brain]
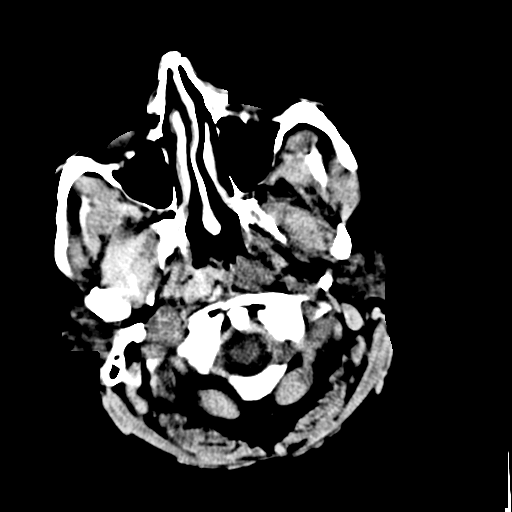
[im 2/36  bone]
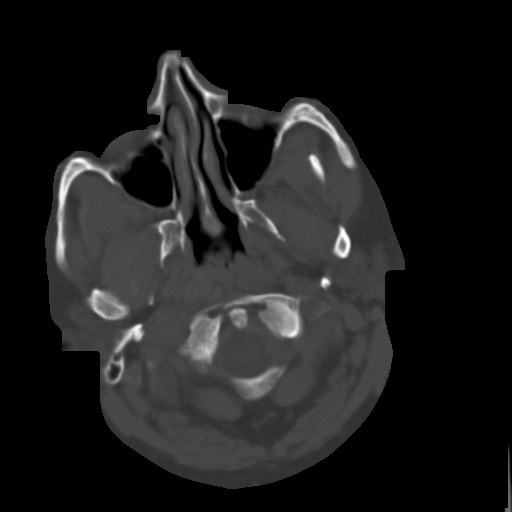
[im 4/36  brain]
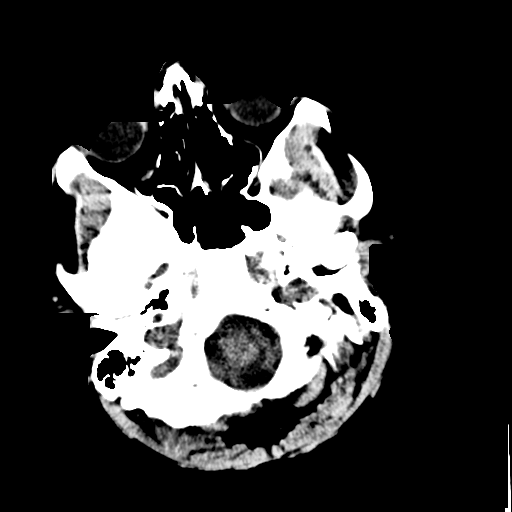
[im 6/36  brain]
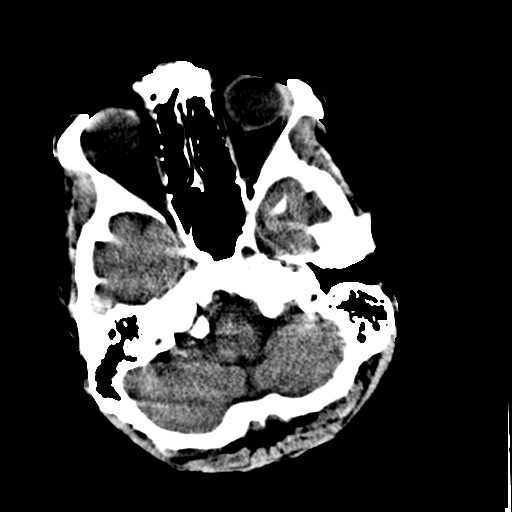
[im 8/36  brain]
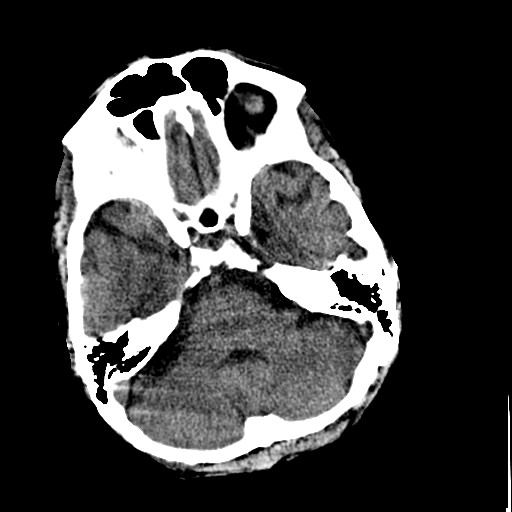
[im 12/36  brain]
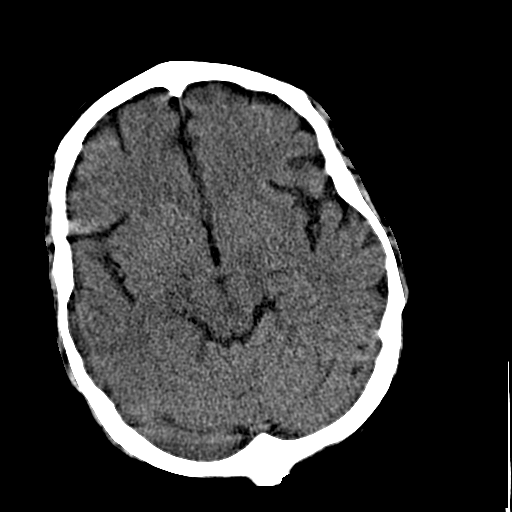
[im 12/36  bone]
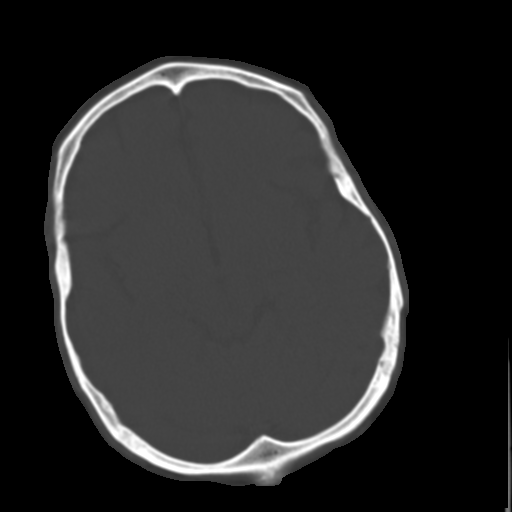
[im 13/36  brain]
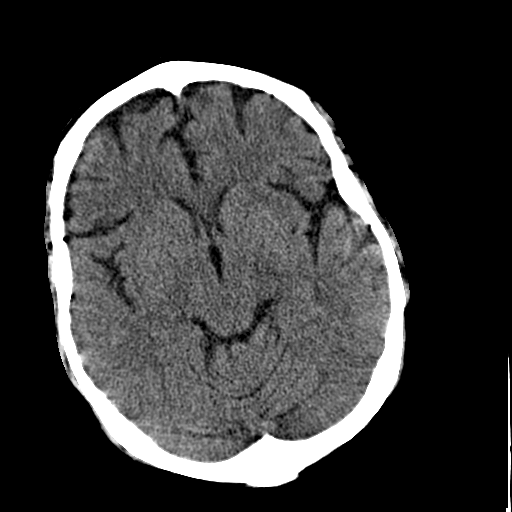
[im 15/36  brain]
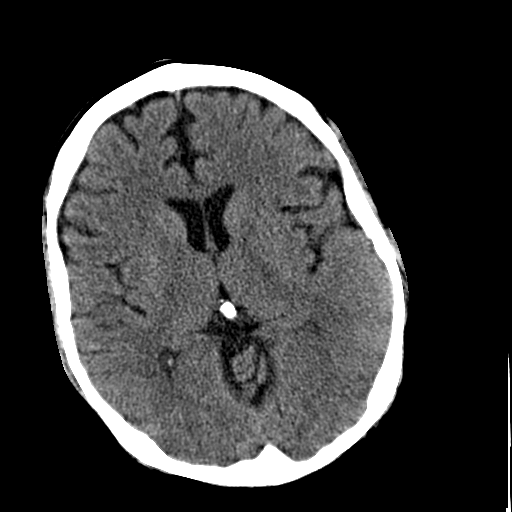
[im 17/36  brain]
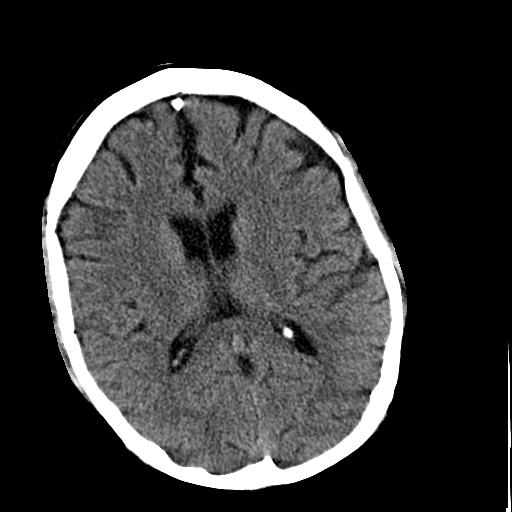
[im 19/36  brain]
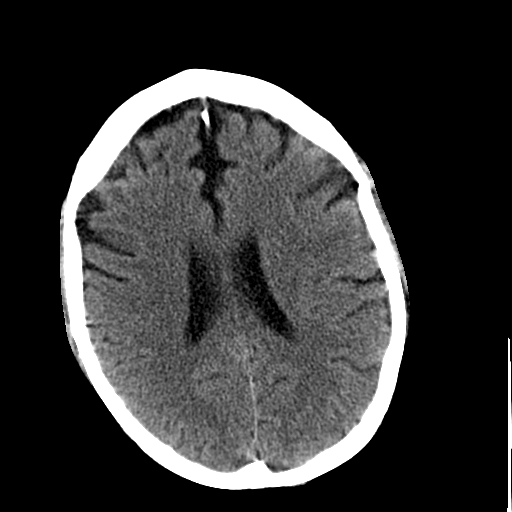
[im 19/36  bone]
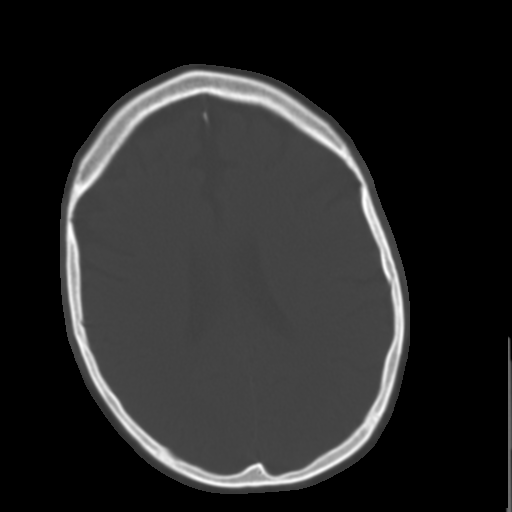
[im 21/36  brain]
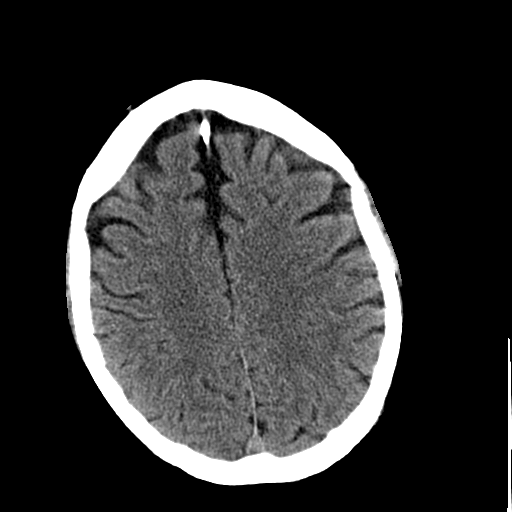
[im 23/36  brain]
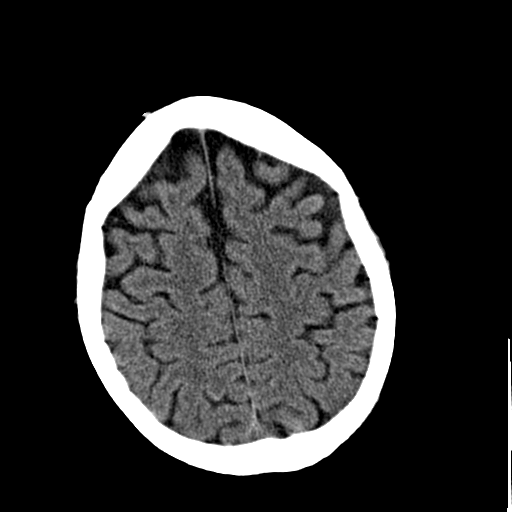
[im 24/36  brain]
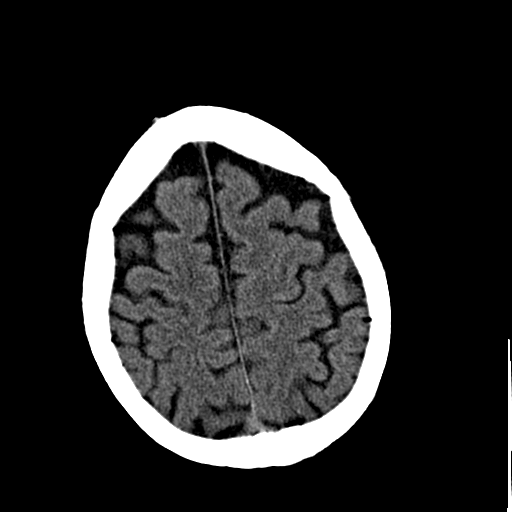
[im 28/36  brain]
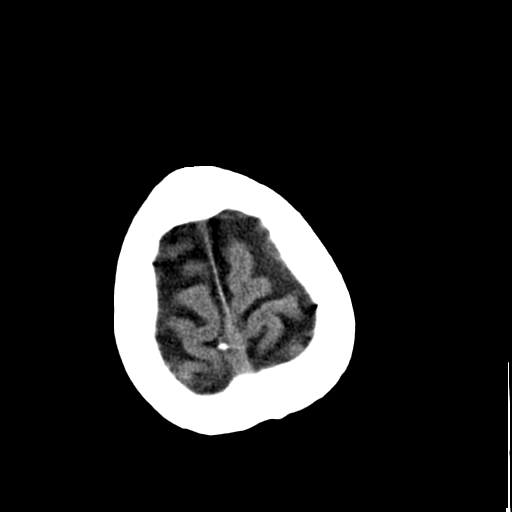
[im 28/36  bone]
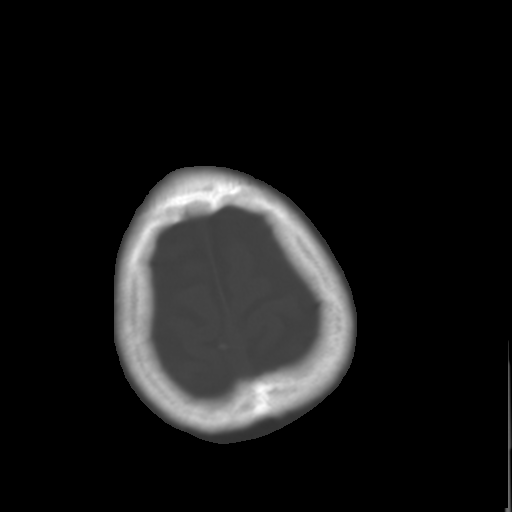
[im 30/36  brain]
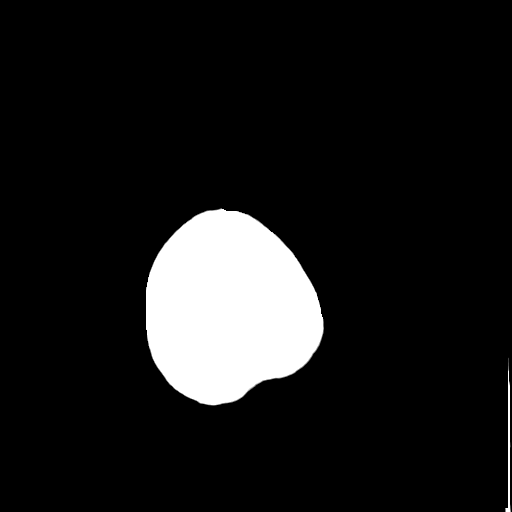
[im 32/36  brain]
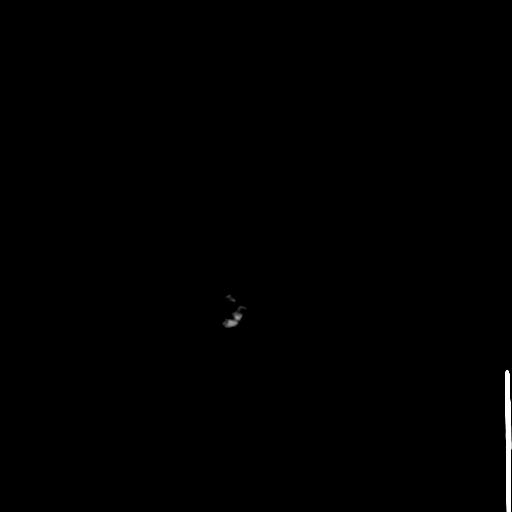
[im 34/36  brain]
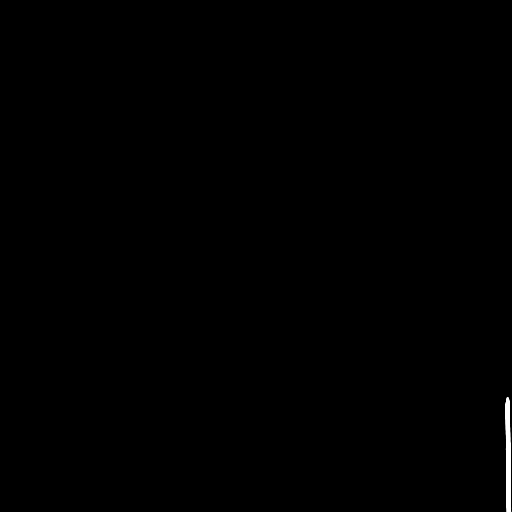

[16 of 30 positions shown; findings below may reference images not displayed]

FINDINGS: Mild diffuse atrophy. No abnormal attenuation to suggest mass
infarct hemorrhage or extra-axial fluid. No hydrocephalus. Calvarium
intact. No inflammatory changes in the visualized sinuses or mastoid
air cells. Study mildly limited by motion artifact.
IMPRESSION: No acute abnormalities. Mild atrophy somewhat disproportionate for
age.

## 2019-08-19 ENCOUNTER — Encounter: Payer: Self-pay | Admitting: Emergency Medicine

## 2019-08-19 ENCOUNTER — Other Ambulatory Visit: Payer: Self-pay

## 2019-08-19 ENCOUNTER — Emergency Department
Admission: EM | Admit: 2019-08-19 | Discharge: 2019-08-19 | Disposition: A | Discharge status: Home / Self Care | Payer: Self-pay | Attending: Emergency Medicine | Admitting: Emergency Medicine

## 2019-08-19 DIAGNOSIS — F1010 Alcohol abuse, uncomplicated: Secondary | ICD-10-CM

## 2019-08-19 DIAGNOSIS — F10229 Alcohol dependence with intoxication, unspecified: Secondary | ICD-10-CM | POA: Insufficient documentation

## 2019-08-19 DIAGNOSIS — F17210 Nicotine dependence, cigarettes, uncomplicated: Secondary | ICD-10-CM | POA: Insufficient documentation

## 2019-08-19 DIAGNOSIS — Z79899 Other long term (current) drug therapy: Secondary | ICD-10-CM | POA: Insufficient documentation

## 2019-08-19 DIAGNOSIS — Y906 Blood alcohol level of 120-199 mg/100 ml: Secondary | ICD-10-CM | POA: Insufficient documentation

## 2019-08-19 LAB — COMPREHENSIVE METABOLIC PANEL
ALT: 71 U/L — ABNORMAL HIGH (ref 0–44)
AST: 129 U/L — ABNORMAL HIGH (ref 15–41)
Albumin: 3.6 g/dL (ref 3.5–5.0)
Alkaline Phosphatase: 144 U/L — ABNORMAL HIGH (ref 38–126)
Anion gap: 16 — ABNORMAL HIGH (ref 5–15)
BUN: 11 mg/dL (ref 6–20)
CO2: 27 mmol/L (ref 22–32)
Calcium: 8.8 mg/dL — ABNORMAL LOW (ref 8.9–10.3)
Chloride: 92 mmol/L — ABNORMAL LOW (ref 98–111)
Creatinine, Ser: 0.95 mg/dL (ref 0.61–1.24)
GFR calc Af Amer: >60 mL/min (ref >60)
GFR calc non Af Amer: >60 mL/min (ref >60)
Glucose, Bld: 285 mg/dL — ABNORMAL HIGH (ref 70–99)
Potassium: 4 mmol/L (ref 3.5–5.1)
Sodium: 135 mmol/L (ref 135–145)
Total Bilirubin: 2.5 mg/dL — ABNORMAL HIGH (ref 0.3–1.2)
Total Protein: 8 g/dL (ref 6.5–8.1)

## 2019-08-19 LAB — CBC
HCT: 40.2 % (ref 39.0–52.0)
Hemoglobin: 13.6 g/dL (ref 13.0–17.0)
MCH: 32.7 pg (ref 26.0–34.0)
MCHC: 33.8 g/dL (ref 30.0–36.0)
MCV: 96.6 fL (ref 80.0–100.0)
Platelets: 136 10*3/uL — ABNORMAL LOW (ref 150–400)
RBC: 4.16 MIL/uL — ABNORMAL LOW (ref 4.22–5.81)
RDW: 14.8 % (ref 11.5–15.5)
WBC: 4.3 10*3/uL (ref 4.0–10.5)
nRBC: 0 % (ref 0.0–0.2)

## 2019-08-19 LAB — URINE DRUG SCREEN, QUALITATIVE (ARMC ONLY)
Amphetamines, Ur Screen: NOT DETECTED
Barbiturates, Ur Screen: NOT DETECTED
Benzodiazepine, Ur Scrn: NOT DETECTED
Cannabinoid 50 Ng, Ur ~~LOC~~: NOT DETECTED
Cocaine Metabolite,Ur ~~LOC~~: NOT DETECTED
MDMA (Ecstasy)Ur Screen: NOT DETECTED
Methadone Scn, Ur: NOT DETECTED
Opiate, Ur Screen: NOT DETECTED
Phencyclidine (PCP) Ur S: NOT DETECTED
Tricyclic, Ur Screen: NOT DETECTED

## 2019-08-19 LAB — ETHANOL: Alcohol, Ethyl (B): 133 mg/dL — ABNORMAL HIGH (ref <10)

## 2019-08-19 MED ORDER — CHLORDIAZEPOXIDE HCL 25 MG PO CAPS
50.0000 mg | ORAL_CAPSULE | Freq: Once | ORAL | Status: AC
  Administered 2019-08-19: 50 mg via ORAL

## 2019-08-19 MED ORDER — CHLORDIAZEPOXIDE HCL 25 MG PO CAPS: ORAL_CAPSULE | ORAL | 0 refills | Status: AC

## 2019-08-19 NOTE — ED Notes (Signed)
Pt to be discharged home with a prescription for Librium.

## 2019-08-19 NOTE — ED Notes (Signed)
Pt dressing for discharge.  

## 2019-08-19 NOTE — ED Notes (Signed)
Pt having difficulty finding a ride, courtesy car not available to assist pt to the bus stop. Pt beginning to shake and reports that he is starting to feel bad and either needs help getting his prescription filled now or in need of a beer, Dr Archie Balboa aware and order received to give pt his first dose of librium while waiting for a ride

## 2019-08-19 NOTE — ED Provider Notes (Signed)
[facility] Emergency Department Provider Note   ____________________________________________   I have reviewed the triage vital signs and the nursing notes.   HISTORY  Chief Complaint Alcohol use  History limited by: Not Limited   HPI Brandon Oneill is a 39 y.o. male who presents to the emergency department today for help with detox from alcohol use.  Patient states that he is a heavy alcohol drinker.  He would like to try to come off of alcohol.  States that he is had issues with complicated withdrawal in the past.  He states that when he drinks he feels like his emotions get away from it and becomes angry.  The patient states that when he drinks having he also starts having some abdominal pain. Denies any recent illness.    Records reviewed. Per medical record review patient has a history of alcohol abuse.   Past Medical History:  Diagnosis Date  . Acute alcoholic pancreatitis 57/8469   CT 08/15/14 shows evolving pseudocyts.    . Alcohol withdrawal seizure ([place]) 2013  . Alcoholism ([place]) 08/2014   Chronic. Detox admits x 3, one to [place].   . Anemia 08/2014   macrocytic  . Delirium tremens ([place]) 07/2014    Patient Active Problem List   Diagnosis Date Noted  . Depression 03/21/2015  . Acute pancreatitis   . Pancreatitis, acute 08/16/2014  . Acute encephalopathy 08/16/2014  . Pancreatic pseudocyst 08/16/2014  . Alcohol abuse 08/16/2014  . Normocytic anemia 08/16/2014    Past Surgical History:  Procedure Laterality Date  . INCISION AND DRAINAGE ABSCESS  2008   located on low back    Prior to Admission medications   Medication Sig Start Date End Date Taking? Authorizing Provider  cephALEXin (KEFLEX) 500 MG capsule Take 1 capsule (500 mg total) by mouth 2 (two) times daily. 09/27/15   Lavonia Drafts, MD  chlordiazePOXIDE (LIBRIUM) 25 MG capsule Day 1 - 50 mg every 6 hours Day 2 - 25 mg every 6 hours Day 3 - 25 mg twice a day Day 4- 25 mg at  night 08/19/19   Nance Pear, MD  citalopram (CELEXA) 20 MG tablet Take 1 tablet (20 mg total) by mouth daily. 03/21/15   Johnson, Megan P, DO  ivermectin (STROMECTOL) 3 MG TABS tablet Take 7 tablets (21,000 mcg total) by mouth once. 10/04/15   Nance Pear, MD  permethrin (ELIMITE) 5 % cream Thoroughly massage cream from head to soles of feet leave on for 8-12 hours then rinse off 09/27/15 09/26/16  Lavonia Drafts, MD    Allergies Erythromycin  Family History  Problem Relation Age of Onset  . Alcoholism Father     Social History Social History   Tobacco Use  . Smoking status: Current Every Day Smoker    Packs/day: 0.50    Years: 20.00    Pack years: 10.00    Types: Cigarettes  . Smokeless tobacco: Never Used  Substance Use Topics  . Alcohol use: Yes    Alcohol/week: 0.0 standard drinks    Comment: 4-6 x 40 oz per day   . Drug use: No    Review of Systems Constitutional: No fever/chills Eyes: No visual changes. ENT: No sore throat. Cardiovascular: Denies chest pain. Respiratory: Denies shortness of breath. Gastrointestinal: Positive for abdominal pain with drinking.  Genitourinary: Negative for dysuria. Musculoskeletal: Negative for back pain. Skin: Negative for rash. Neurological: Negative for headaches, focal weakness or numbness.  ____________________________________________   PHYSICAL EXAM:  VITAL SIGNS:  ED Triage Vitals  Enc Vitals Group     BP 08/19/19 1502 118/84     Pulse Rate 08/19/19 1502 89     Resp 08/19/19 1502 18     Temp 08/19/19 1502 97.7 F (36.5 C)     Temp Source 08/19/19 1502 Oral     SpO2 08/19/19 1502 100 %     Weight 08/19/19 1502 195 lb (88.5 kg)     Height 08/19/19 1502 6' (1.829 m)     Head Circumference --      Peak Flow --      Pain Score 08/19/19 1505 0   Constitutional: Alert and oriented.  Eyes: Conjunctivae are normal.  ENT      Head: Normocephalic and atraumatic.      Nose: No congestion/rhinnorhea.       Mouth/Throat: Mucous membranes are moist.      Neck: No stridor. Hematological/Lymphatic/Immunilogical: No cervical lymphadenopathy. Cardiovascular: Normal rate, regular rhythm.  No murmurs, rubs, or gallops.  Respiratory: Normal respiratory effort without tachypnea nor retractions. Breath sounds are clear and equal bilaterally. No wheezes/rales/rhonchi. Gastrointestinal: Soft and non tender. No rebound. No guarding.  Genitourinary: Deferred Musculoskeletal: Normal range of motion in all extremities. No lower extremity edema. Neurologic:  Normal speech and language. No gross focal neurologic deficits are appreciated.  Skin:  Skin is warm, dry and intact. No rash noted. Psychiatric: Mood and affect are normal. Speech and behavior are normal. Patient exhibits appropriate insight and judgment.  ____________________________________________    LABS (pertinent positives/negatives)  UDS none detected Ethanol 133 CMP na 135, k 4.0, glu 285, ca 8.8, ast 129, alt 71, alk phos 144, t bili 2.5 CBC wbc 4.3, hgb 13.6, plt 136 ____________________________________________   EKG  None  ____________________________________________    RADIOLOGY  None  ____________________________________________   PROCEDURES  Procedures  ____________________________________________   INITIAL IMPRESSION / ASSESSMENT AND PLAN / ED COURSE  Pertinent labs & imaging results that were available during my care of the patient were reviewed by me and considered in my medical decision making (see chart for details).   Patient presented to the emergency department today with request for help with alcohol detox.  I did discuss with patient that we can have behavioral health see him to try to place him in a detox facility.  Patient however declined stating that he did not want to go to any of the local inpatient facilities.  He states he would like to try detoxing at home.  He would like some medicine to help with  his detox.  I do think would be reasonable to give patient prescription for Librium to help with alcohol withdrawal symptoms.  Patient's blood work does show elevated liver enzymes which is consistent with the patient's reported alcohol use. ___________________________________________   FINAL CLINICAL IMPRESSION(S) / ED DIAGNOSES  Final diagnoses:  Alcohol abuse     Note: This dictation was prepared with Dragon dictation. Any transcriptional errors that result from this process are unintentional     Nance Pear, MD 08/19/19 [id number]

## 2019-08-19 NOTE — ED Triage Notes (Signed)
Pt presents to ED wanting help for ETOH detox. Pt states he typically drinks x6 40oz malt liquor/day. Last drink was 0730 this morning. Reports hx withdrawal seizure.   Pt endorses HI, states he has thoughts of "wanting to kill the people that piss me off. When I'm drinking I get angry and can't control my emotions."

## 2019-08-19 NOTE — ED Notes (Signed)
Pt discharged to lobby with bus pass.  VS stable. All belongings returned to patient. Discharge instructions and prescriptions reviewed with patient. Pt signed for discharge. Pt denies SI/HI.

## 2019-08-19 NOTE — ED Notes (Signed)
Pt dressed out with EDT [place]. Belongings include: jeans, white t-shirt, gray jacket, belt, black shoes, socks, cellphone, wallet, pen, lighter. Pt also has a backpack, large red suitcase, and large red duffel bag.   Backpack has Copy, phone charger, and 2 airpod cases (pink one empty and black one with air pods), 1 bottle of keflex, and 1 bottle of bactrim.   Suitcase and duffel bag full of clothing.

## 2019-08-19 NOTE — ED Notes (Signed)
This EDT gave this pt a blanket.

## 2019-08-19 NOTE — ED Notes (Signed)
Pt given a bus pass and [facility] Rx coupon for his prescription medication.

## 2019-08-19 NOTE — Discharge Instructions (Addendum)
Please seek medical attention for any high fevers, chest pain, shortness of breath, change in behavior, persistent vomiting, bloody stool or any other new or concerning symptoms.
